# Patient Record
Sex: Male | Born: 1965 | Race: White | Hispanic: No | Marital: Married | Smoking: Never smoker
Health system: Southern US, Community
[De-identification: ages and names within clinical notes are randomized; demographics above are authoritative.]

---

## 2010-08-09 ENCOUNTER — Ambulatory Visit
Admission: RE | Admit: 2010-08-09 | Discharge: 2010-08-09 | Disposition: A | Discharge status: Home / Self Care | Payer: 59 | Source: Ambulatory Visit | Attending: Pulmonary Disease | Admitting: Pulmonary Disease

## 2010-08-09 ENCOUNTER — Other Ambulatory Visit: Payer: Self-pay | Admitting: Pulmonary Disease

## 2010-10-09 ENCOUNTER — Ambulatory Visit (HOSPITAL_BASED_OUTPATIENT_CLINIC_OR_DEPARTMENT_OTHER): Admission: RE | Admit: 2010-10-09 | Payer: 59 | Source: Ambulatory Visit | Admitting: Otolaryngology

## 2010-12-18 ENCOUNTER — Ambulatory Visit (HOSPITAL_BASED_OUTPATIENT_CLINIC_OR_DEPARTMENT_OTHER)
Admission: RE | Admit: 2010-12-18 | Discharge: 2010-12-18 | Disposition: A | Discharge status: Home / Self Care | Payer: 59 | Source: Ambulatory Visit | Attending: Otolaryngology | Admitting: Otolaryngology

## 2010-12-18 ENCOUNTER — Other Ambulatory Visit (INDEPENDENT_AMBULATORY_CARE_PROVIDER_SITE_OTHER): Payer: Self-pay | Admitting: Otolaryngology

## 2010-12-18 DIAGNOSIS — J320 Chronic maxillary sinusitis: Secondary | ICD-10-CM | POA: Insufficient documentation

## 2010-12-18 DIAGNOSIS — Z01812 Encounter for preprocedural laboratory examination: Secondary | ICD-10-CM | POA: Insufficient documentation

## 2010-12-18 DIAGNOSIS — J343 Hypertrophy of nasal turbinates: Secondary | ICD-10-CM | POA: Insufficient documentation

## 2010-12-18 LAB — POCT HEMOGLOBIN-HEMACUE: Hemoglobin: 15 g/dL (ref 13.0–17.0)

## 2010-12-26 NOTE — Op Note (Signed)
Jeff Trujillo, Jeff Trujillo NO.:  [id number]  MEDICAL RECORD NO.:  [id number]  LOCATION:                                 FACILITY:  PHYSICIAN:  Newman Pies, MD                 DATE OF BIRTH:  DATE OF PROCEDURE:  12/18/2010 DATE OF DISCHARGE:                              OPERATIVE REPORT   SURGEON:  Newman Pies, MD.  PREOPERATIVE DIAGNOSES: 1. Bilateral chronic maxillary sinusitis. 2. Bilateral chronic nasal obstruction. 3. Bilateral inferior turbinate hypertrophy.  POSTOPERATIVE DIAGNOSES: 1. Bilateral chronic maxillary sinusitis. 2. Bilateral chronic nasal obstruction. 3. Bilateral inferior turbinate hypertrophy.  PROCEDURE PERFORMED: 1. Bilateral maxillary antrostomy with tissue removal. 2. Bilateral partial inferior turbinate resection.  ANESTHESIA:  General endotracheal tube anesthesia.  COMPLICATIONS:  None.  ESTIMATED BLOOD LOSS:  50 mL.  INDICATIONS FOR PROCEDURE:  The patient is a 45 year old male with a history of chronic nasal obstruction and chronic maxillary sinusitis. He previously underwent septoplasty and bilateral turbinate reduction surgery in 2006.  He was previously treated with multiple courses of antibiotics, nasal saline irrigation, and steroid nasal spray.  However, CT scan showed persistent bilateral maxillary opacification.  His recent CT scan showed worsening of his opacification versus his prior imaging study in 2006.  On examination, he was also noted to have significant bilateral inferior turbinate hypertrophy.  Based on the above findings, the decision was made for the patient to undergo the above-stated procedures.  The risks, benefits, alternatives, and details of the procedure were discussed with the patient and his wife.  Questions were invited and answered.  Informed consent was obtained.  DESCRIPTION OF PROCEDURE:  The patient was taken to the operating room and placed supine on the operating table.  General endotracheal  tube anesthesia was administered by the anesthesiologist.  Preop IV antibiotics and Decadron were given.  The patient was prepped and draped in a standard fashion for nasal surgery.  Pledgets soaked with Afrin were placed in both nasal cavities for vasoconstriction.  The pledgets were subsequently removed.  Endoscopic evaluation of both nasal cavities revealed bilateral inferior turbinate hypertrophy and nasal mucosal congestion.  Attention was first focused on the left side.  The left middle turbinate was medialized.  1% lidocaine with 1:100,000 epinephrine was injected onto the lateral nasal wall.  A standard uncinectomy procedure was performed.  At this time, the maxillary opening was completely covered with polypoid tissue.  The polypoid tissue was carefully removed with a combination of Tru-Cut forceps and microdebrider.  The maxillary cavity was noted to be filled with polypoid tissue and a large fungal ball.  The maxillary content was carefully debrided and removed in a piecemeal fashion.  The specimens were sent to pathology department for permanent histologic identification and fungal culture.  The maxillary cavity was copiously irrigated with saline.  Attention was then focused on the inferior turbinate.  The inferior one half of the inferior turbinate was crossclamped with a straight Kelly clamp.  The inferior one half of the inferior turbinate was then resected with a pair of cross cutting scissors.  Hemostasis was achieved with a suction electrocautery device. The  same procedure was repeated on the right side without exception.  No fungal ball was noted on the right side.  However, the maxillary antrum and the maxillary cavity was noted to be covered with polypoid tissue. NasoPore dressing was placed within the middle meatus bilaterally.  That concluded procedure for the patient.  The care of the patient was turned over to the anesthesiologist.  The patient was awakened from  anesthesia without difficulty.  He was extubated and transferred to the recovery room in good condition.  OPERATIVE FINDINGS: 1. Bilateral middle meatus occluded with polypoid tissue.  The left     maxillary sinus was completely filled with fungal ball.  However,     no active infection was noted today. 2. Bilateral inferior turbinate hypertrophy.  SPECIMEN:  Bilateral sinus contents.  FOLLOWUP CARE:  The patient will be discharged home once he is awake and alert.  He will be placed on Vicodin 1-2 tablets p.o. q.4-6 h. p.r.n. pain, and amoxicillin 500 mg p.o. t.i.d. for 5 days.  The patient will follow up in my office in approximately 1 week.     Newman Pies, MD     ST/MEDQ  D:  12/18/2010  T:  12/18/2010  Job:  [id number]  cc:   Kerry Kass, M.D. [facility]  Electronically Signed by Newman Pies MD on 12/26/2010 12:19:36 PM

## 2012-03-02 IMAGING — CT CT MAXILLOFACIAL W/O CM
2 of 3 series · 15 of 37 positions shown, 18 images · non-contrast
Comparison: None

CLINICAL DATA: Chronic sinusitis and congestion.  Sinus surgery

CT MAXILLOFACIAL WITHOUT CONTRAST
TECHNIQUE: Multidetector CT imaging of the maxillofacial
structures was performed. Multiplanar CT image reconstructions were
also generated.

[Series 601: coronal facial · coronal · 0.36mm/px · 3 of 90 slices shown]
[im 30/90  bone]
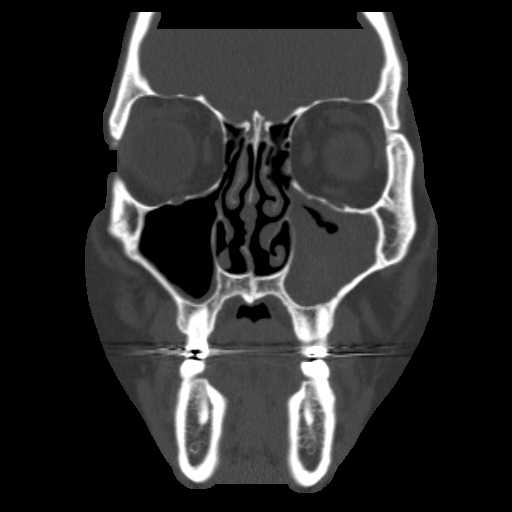
[im 45/90  bone]
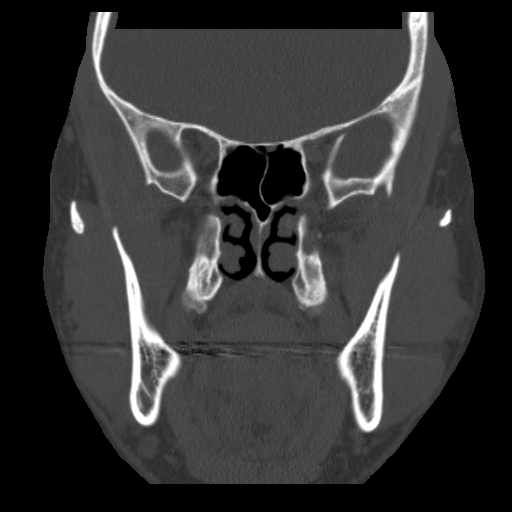
[im 60/90  bone]
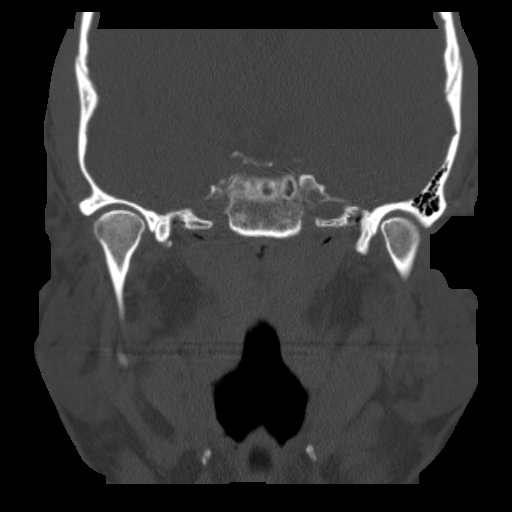

[Series 602: sagittal facial · sagittal · 0.36mm/px · 12 of 92 slices shown, 15 images]
[im 6/92  brain]
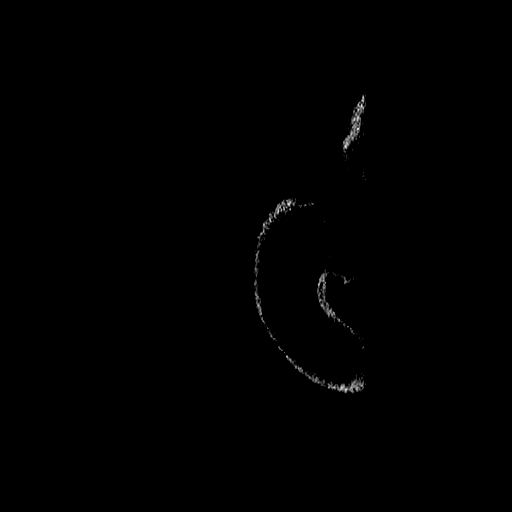
[im 6/92  bone]
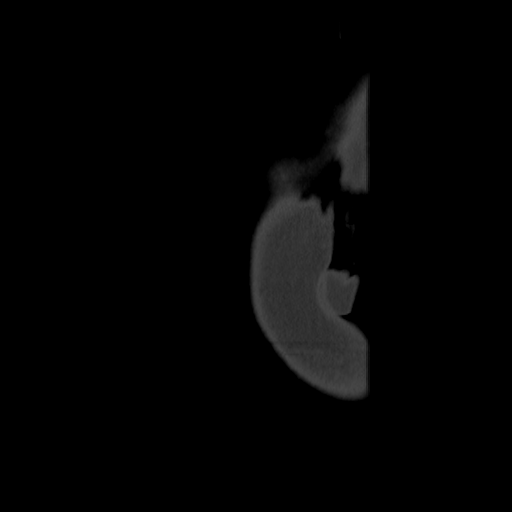
[im 12/92  bone]
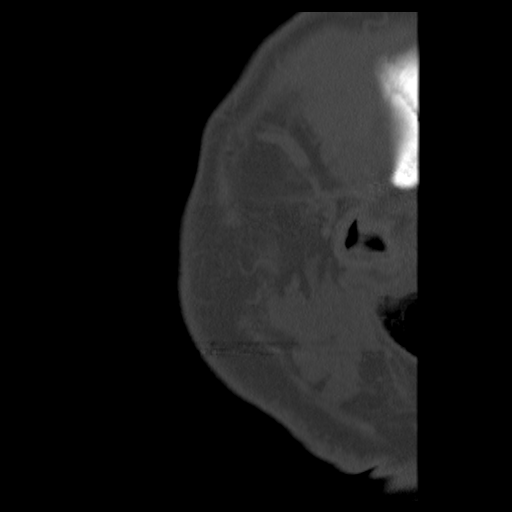
[im 23/92  bone]
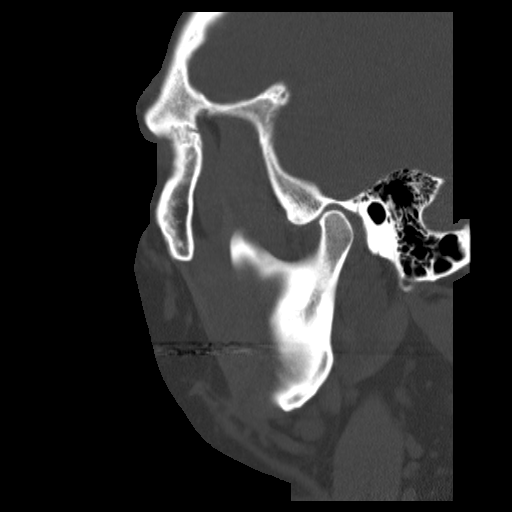
[im 29/92  bone]
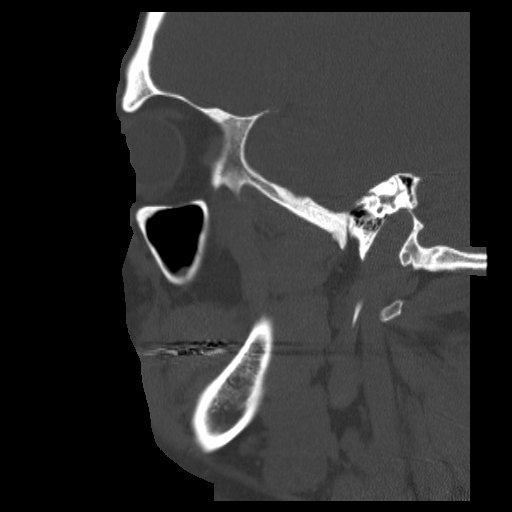
[im 35/92  brain]
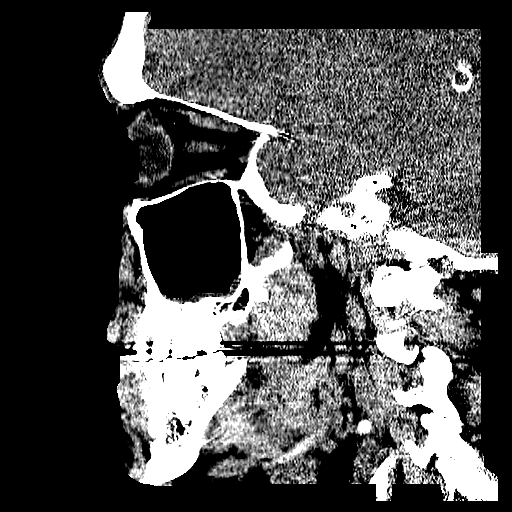
[im 35/92  bone]
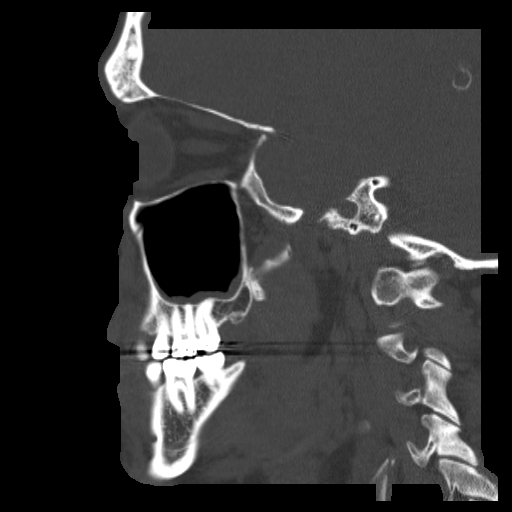
[im 40/92  bone]
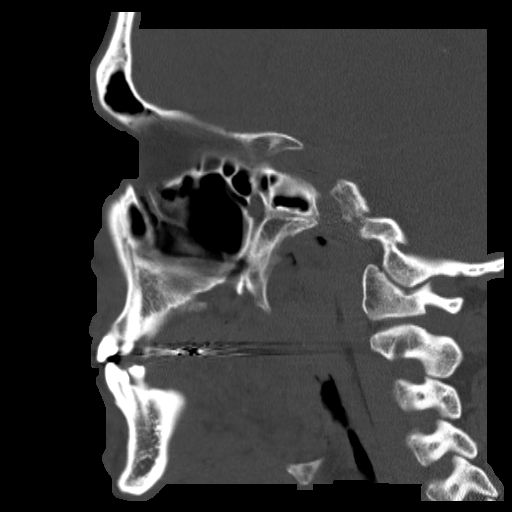
[im 52/92  bone]
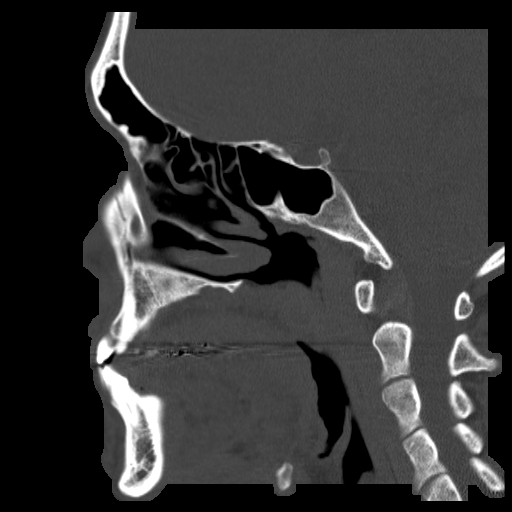
[im 57/92  bone]
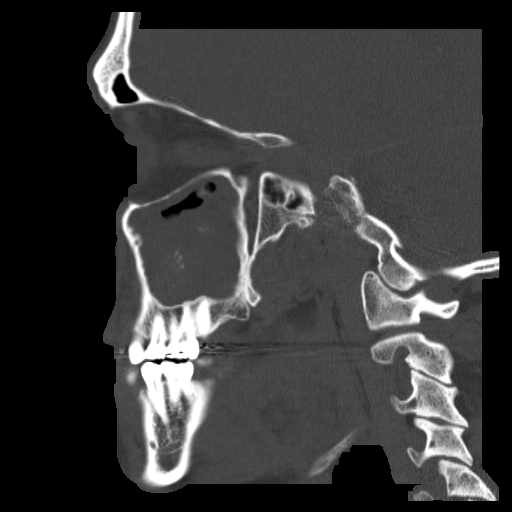
[im 63/92  brain]
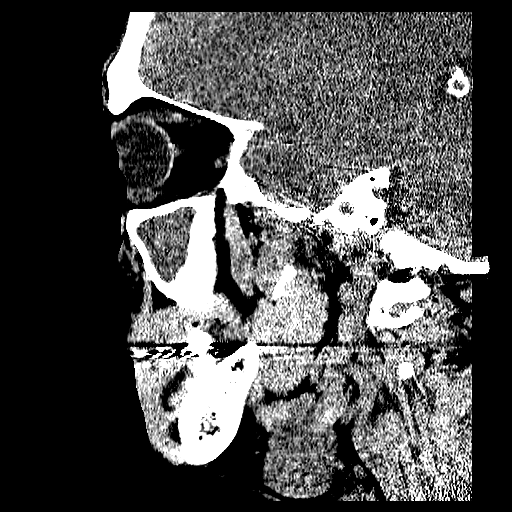
[im 63/92  bone]
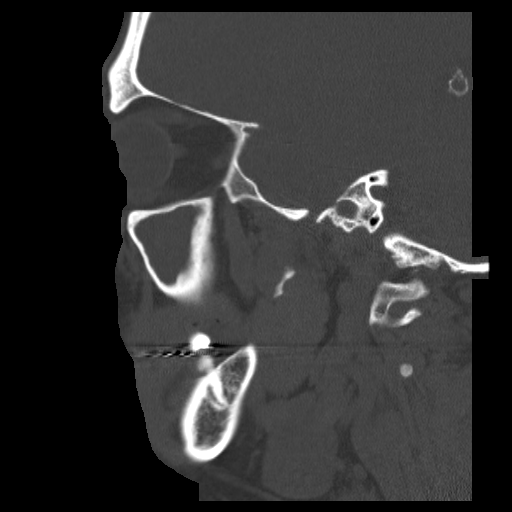
[im 69/92  bone]
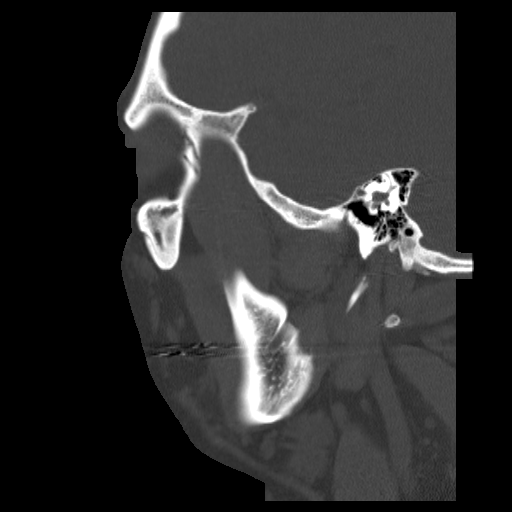
[im 80/92  bone]
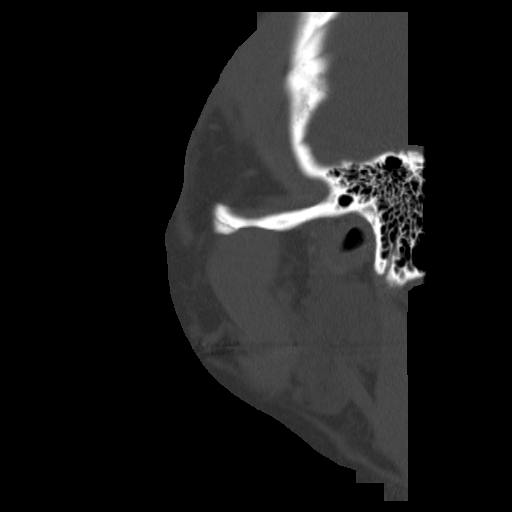
[im 86/92  bone]
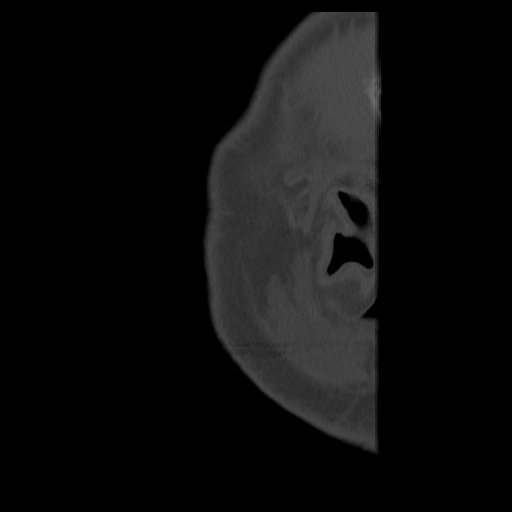

[15 of 37 positions shown; findings below may reference images not displayed]

FINDINGS: Near-complete opacification of the left maxillary sinus.
Calcification is present within the secretions.  Obstruction of the
left maxillary ostium.

Mild mucosal edema in the base of the right maxillary sinus.
Frontal ethmoid and sphenoid sinuses are clear.

No acute bony changes.  Mild chronic thickening of the left
maxillary sinus compatible with chronic sinusitis.  The orbit is
intact.  No soft tissue mass.
IMPRESSION: Chronic sinusitis especially the left maxillary sinus.  No air-
fluid level or bony destruction is identified.

## 2021-01-23 DIAGNOSIS — M8580 Other specified disorders of bone density and structure, unspecified site: Secondary | ICD-10-CM | POA: Diagnosis not present

## 2021-01-23 DIAGNOSIS — R053 Chronic cough: Secondary | ICD-10-CM | POA: Diagnosis not present

## 2021-01-23 DIAGNOSIS — E213 Hyperparathyroidism, unspecified: Secondary | ICD-10-CM | POA: Diagnosis not present

## 2021-01-23 DIAGNOSIS — K219 Gastro-esophageal reflux disease without esophagitis: Secondary | ICD-10-CM | POA: Diagnosis not present

## 2021-01-24 DIAGNOSIS — I871 Compression of vein: Secondary | ICD-10-CM | POA: Diagnosis not present

## 2021-01-29 DIAGNOSIS — L814 Other melanin hyperpigmentation: Secondary | ICD-10-CM | POA: Diagnosis not present

## 2021-01-29 DIAGNOSIS — D225 Melanocytic nevi of trunk: Secondary | ICD-10-CM | POA: Diagnosis not present

## 2021-01-29 DIAGNOSIS — D2272 Melanocytic nevi of left lower limb, including hip: Secondary | ICD-10-CM | POA: Diagnosis not present

## 2021-01-29 DIAGNOSIS — B078 Other viral warts: Secondary | ICD-10-CM | POA: Diagnosis not present

## 2021-02-28 DIAGNOSIS — E785 Hyperlipidemia, unspecified: Secondary | ICD-10-CM | POA: Diagnosis not present

## 2021-02-28 DIAGNOSIS — Z0000 Encounter for general adult medical examination without abnormal findings: Secondary | ICD-10-CM | POA: Diagnosis not present

## 2021-02-28 DIAGNOSIS — Z125 Encounter for screening for malignant neoplasm of prostate: Secondary | ICD-10-CM | POA: Diagnosis not present

## 2021-03-05 DIAGNOSIS — R7301 Impaired fasting glucose: Secondary | ICD-10-CM | POA: Diagnosis not present

## 2021-03-05 DIAGNOSIS — Z23 Encounter for immunization: Secondary | ICD-10-CM | POA: Diagnosis not present

## 2021-03-05 DIAGNOSIS — Z0000 Encounter for general adult medical examination without abnormal findings: Secondary | ICD-10-CM | POA: Diagnosis not present

## 2021-03-05 DIAGNOSIS — E785 Hyperlipidemia, unspecified: Secondary | ICD-10-CM | POA: Diagnosis not present

## 2021-03-20 DIAGNOSIS — B078 Other viral warts: Secondary | ICD-10-CM | POA: Diagnosis not present

## 2021-03-20 DIAGNOSIS — R053 Chronic cough: Secondary | ICD-10-CM | POA: Diagnosis not present

## 2021-03-20 DIAGNOSIS — K219 Gastro-esophageal reflux disease without esophagitis: Secondary | ICD-10-CM | POA: Diagnosis not present

## 2021-03-29 DIAGNOSIS — K5900 Constipation, unspecified: Secondary | ICD-10-CM | POA: Diagnosis not present

## 2021-03-30 DIAGNOSIS — K5901 Slow transit constipation: Secondary | ICD-10-CM | POA: Diagnosis not present

## 2021-03-30 DIAGNOSIS — R1030 Lower abdominal pain, unspecified: Secondary | ICD-10-CM | POA: Diagnosis not present

## 2021-04-02 DIAGNOSIS — H25813 Combined forms of age-related cataract, bilateral: Secondary | ICD-10-CM | POA: Diagnosis not present

## 2021-04-03 DIAGNOSIS — R1030 Lower abdominal pain, unspecified: Secondary | ICD-10-CM | POA: Diagnosis not present

## 2021-04-03 DIAGNOSIS — K5900 Constipation, unspecified: Secondary | ICD-10-CM | POA: Diagnosis not present

## 2021-04-18 DIAGNOSIS — K5900 Constipation, unspecified: Secondary | ICD-10-CM | POA: Diagnosis not present

## 2021-04-18 DIAGNOSIS — R194 Change in bowel habit: Secondary | ICD-10-CM | POA: Diagnosis not present

## 2021-04-19 DIAGNOSIS — B079 Viral wart, unspecified: Secondary | ICD-10-CM | POA: Diagnosis not present

## 2021-05-22 DIAGNOSIS — M859 Disorder of bone density and structure, unspecified: Secondary | ICD-10-CM | POA: Diagnosis not present

## 2021-05-22 DIAGNOSIS — E213 Hyperparathyroidism, unspecified: Secondary | ICD-10-CM | POA: Diagnosis not present

## 2021-05-24 DIAGNOSIS — M79672 Pain in left foot: Secondary | ICD-10-CM | POA: Diagnosis not present

## 2021-05-28 DIAGNOSIS — M545 Low back pain, unspecified: Secondary | ICD-10-CM | POA: Diagnosis not present

## 2021-05-28 DIAGNOSIS — R7301 Impaired fasting glucose: Secondary | ICD-10-CM | POA: Diagnosis not present

## 2021-05-28 DIAGNOSIS — E785 Hyperlipidemia, unspecified: Secondary | ICD-10-CM | POA: Diagnosis not present

## 2021-05-28 DIAGNOSIS — R4584 Anhedonia: Secondary | ICD-10-CM | POA: Diagnosis not present

## 2021-06-17 DIAGNOSIS — R059 Cough, unspecified: Secondary | ICD-10-CM | POA: Diagnosis not present

## 2021-06-17 DIAGNOSIS — J029 Acute pharyngitis, unspecified: Secondary | ICD-10-CM | POA: Diagnosis not present

## 2021-06-17 DIAGNOSIS — H109 Unspecified conjunctivitis: Secondary | ICD-10-CM | POA: Diagnosis not present

## 2021-07-06 DIAGNOSIS — B9689 Other specified bacterial agents as the cause of diseases classified elsewhere: Secondary | ICD-10-CM | POA: Diagnosis not present

## 2021-07-06 DIAGNOSIS — J42 Unspecified chronic bronchitis: Secondary | ICD-10-CM | POA: Diagnosis not present

## 2021-07-11 DIAGNOSIS — H527 Unspecified disorder of refraction: Secondary | ICD-10-CM | POA: Diagnosis not present

## 2021-07-11 DIAGNOSIS — H25813 Combined forms of age-related cataract, bilateral: Secondary | ICD-10-CM | POA: Diagnosis not present

## 2021-07-16 ENCOUNTER — Encounter: Payer: Self-pay | Admitting: Behavioral Health

## 2021-07-16 ENCOUNTER — Other Ambulatory Visit: Payer: Self-pay

## 2021-07-16 ENCOUNTER — Ambulatory Visit (INDEPENDENT_AMBULATORY_CARE_PROVIDER_SITE_OTHER): Payer: BC Managed Care – PPO | Admitting: Behavioral Health

## 2021-07-16 VITALS — BP 123/82 | HR 82 | Ht 72.0 in | Wt 243.0 lb

## 2021-07-16 DIAGNOSIS — R454 Irritability and anger: Secondary | ICD-10-CM | POA: Diagnosis not present

## 2021-07-16 DIAGNOSIS — R4589 Other symptoms and signs involving emotional state: Secondary | ICD-10-CM | POA: Diagnosis not present

## 2021-07-16 MED ORDER — DULOXETINE HCL 30 MG PO CPEP: 30.0000 mg | ORAL_CAPSULE | Freq: Every day | ORAL | 3 refills | Status: DC

## 2021-07-16 NOTE — Progress Notes (Signed)
[facility] MD/PA/NP Initial Note ? ?07/16/2021 3:06 PM ?Jeff Trujillo  ?MRN:  [id number] ? ?Chief Complaint:  ?Chief Complaint   ?Irritability; Establish Care; Medication Refill; Mood Changes ?  ? ? ?HPI:  ?56 year old presents to this office for initial visit and to establish care. He says that his wife began to notice changes in his moods with increasing irritability and "grumpiness" all the time. He was not sure what the cause was. He feels that this started to effect his relationship with his wife. Says that there also has been stressors related to his wife's ongoing health issue.Says he and his wife are very close but they have not been intimate in almost a year. He has been checked for low T and currently has medication for ED on hand for when their emotional well being improves and sex is desired. He has been under the care of Dr. Edgar Frisk as was prescribed Cymbalta. He did not believe it was working but the medication "Kicked in" after a month or so. Says he is feeling much better and currently has zero anxiety and depression due to the medication working. Negative PHQ-2. Says he would like psychiatry to continue to manage his medication and provide refills. He does not want to make any changes at this time. Denies mania, no psychosis, No SI/HI.  ? ?No prior psychiatric HX.   ? ? ?Visit Diagnosis:  ?  ICD-10-CM   ?1. Irritability  R45.4 DULoxetine (CYMBALTA) 30 MG capsule  ?  ?2. Moodiness  R45.89 DULoxetine (CYMBALTA) 30 MG capsule  ?  ? ? ?Past Psychiatric History: none ? ?Past Medical History: see chart ? ?Family Psychiatric History: none ? ?Family History: No family history on file. ? ?Social History:  ?Social History  ? ?Socioeconomic History  ? Marital status: Married  ?  Spouse name: Kerby Less  ? Number of children: 0  ? Years of education: 55  ? Highest education level: Bachelor's degree (e.g., BA, AB, BS)  ?Occupational History  ? Occupation: Higher education careers adviser  ?  Comment: [employer]  ?Tobacco Use  ?  Smoking status: Not on file  ? Smokeless tobacco: Not on file  ?Substance and Sexual Activity  ? Alcohol use: Not on file  ? Drug use: Not on file  ? Sexual activity: Not Currently  ?Other Topics Concern  ? Not on file  ?Social History Narrative  ? Lives in [place] with wife. Enjoys Racing cars, scuba diving in free time.  ? ?Social Determinants of Health  ? ?Financial Resource Strain: Not on file  ?Food Insecurity: Not on file  ?Transportation Needs: Not on file  ?Physical Activity: Not on file  ?Stress: Not on file  ?Social Connections: Not on file  ? ? ?Allergies: No Known Allergies ? ?Metabolic Disorder Labs: ?No results found for: HGBA1C, MPG ?No results found for: PROLACTIN ?No results found for: CHOL, TRIG, HDL, CHOLHDL, VLDL, LDLCALC ?No results found for: TSH ? ?Therapeutic Level Labs: ?No results found for: LITHIUM ?No results found for: VALPROATE ?No components found for:  CBMZ ? ?Current Medications: ?Current Outpatient Medications  ?Medication Sig Dispense Refill  ? sildenafil (VIAGRA) 100 MG tablet Take by mouth.    ? ALLEGRA ALLERGY 60 MG tablet     ? cyclobenzaprine (FLEXERIL) 5 MG tablet     ? DULoxetine (CYMBALTA) 30 MG capsule Take 1 capsule (30 mg total) by mouth daily. 30 capsule 3  ? gabapentin (NEURONTIN) 300 MG capsule     ? meloxicam (MOBIC) 15 MG  tablet     ? PRILOSEC 2.5 MG PACK     ? rosuvastatin (CRESTOR) 20 MG tablet Take 20 mg by mouth at bedtime.    ? trimethoprim-polymyxin b (POLYTRIM) ophthalmic solution SMARTSIG:In Eye(s)    ? ?No current facility-administered medications for this visit.  ? ? ?Medication Side Effects: none ? ?Orders placed this visit:  No orders of the defined types were placed in this encounter. ? ? ?Psychiatric Specialty Exam: ? ?Review of Systems  ?HENT:  Positive for nosebleeds and sinus pain.   ?Eyes:  Positive for visual disturbance.  ?Allergic/Immunologic: Positive for environmental allergies.  ?Neurological: Negative.    ?There were no vitals taken for  this visit.There is no height or weight on file to calculate BMI.  ?General Appearance: Casual, Neat, and Well Groomed  ?Eye Contact:  Good  ?Speech:  Clear and Coherent  ?Volume:  Normal  ?Mood:  NA  ?Affect:  Appropriate  ?Thought Process:  Coherent  ?Orientation:  Full (Time, Place, and Person)  ?Thought Content: Logical   ?Suicidal Thoughts:  No  ?Homicidal Thoughts:  No  ?Memory:  WNL  ?Judgement:  Good  ?Insight:  Good  ?Psychomotor Activity:  Normal  ?Concentration:  Concentration: Good  ?Recall:  Good  ?Fund of Knowledge: Good  ?Language: Good  ?Assets:  Desire for Improvement  ?ADL's:  Intact  ?Cognition: WNL  ?Prognosis:  Good  ? ?Screenings:  ?PHQ2-9   ? ?Flowsheet Row Office Visit from 07/16/2021 in [facility]  ?PHQ-2 Total Score 0  ? ?  ? ? ?Receiving Psychotherapy: No  ? ?Treatment Plan/Recommendations:  ? ?Greater than 50% of face to face time with patient was spent on counseling and coordination of care. We discussed his new hx of irritability and moodiness. He has ruled out low T. We discussed Cymbalta working very well for him and no changes to his medications required. To continue monitoring and will follow up in 2 months. ?To continue Cymbalta 30 mg daily ?To report side effects or worsening symptoms ?Will follow up in 2 months to reassess ?Provided emergency contact information ?Reviewed PDMP   ? ? ? ?Joan Flores, NP ? ?         ?

## 2021-08-15 DIAGNOSIS — R7301 Impaired fasting glucose: Secondary | ICD-10-CM | POA: Diagnosis not present

## 2021-08-15 DIAGNOSIS — E785 Hyperlipidemia, unspecified: Secondary | ICD-10-CM | POA: Diagnosis not present

## 2021-08-15 DIAGNOSIS — Z79899 Other long term (current) drug therapy: Secondary | ICD-10-CM | POA: Diagnosis not present

## 2021-09-14 DIAGNOSIS — R4584 Anhedonia: Secondary | ICD-10-CM | POA: Diagnosis not present

## 2021-09-14 DIAGNOSIS — R7301 Impaired fasting glucose: Secondary | ICD-10-CM | POA: Diagnosis not present

## 2021-09-14 DIAGNOSIS — Z23 Encounter for immunization: Secondary | ICD-10-CM | POA: Diagnosis not present

## 2021-09-14 DIAGNOSIS — E785 Hyperlipidemia, unspecified: Secondary | ICD-10-CM | POA: Diagnosis not present

## 2021-09-18 DIAGNOSIS — K219 Gastro-esophageal reflux disease without esophagitis: Secondary | ICD-10-CM | POA: Diagnosis not present

## 2021-09-18 DIAGNOSIS — H9042 Sensorineural hearing loss, unilateral, left ear, with unrestricted hearing on the contralateral side: Secondary | ICD-10-CM | POA: Diagnosis not present

## 2021-09-18 DIAGNOSIS — R053 Chronic cough: Secondary | ICD-10-CM | POA: Diagnosis not present

## 2021-09-25 ENCOUNTER — Other Ambulatory Visit: Payer: Self-pay | Admitting: Otolaryngology

## 2021-09-25 DIAGNOSIS — H9042 Sensorineural hearing loss, unilateral, left ear, with unrestricted hearing on the contralateral side: Secondary | ICD-10-CM

## 2021-10-08 ENCOUNTER — Ambulatory Visit: Payer: BC Managed Care – PPO | Admitting: Behavioral Health

## 2021-10-10 ENCOUNTER — Other Ambulatory Visit: Payer: Self-pay

## 2021-10-10 DIAGNOSIS — R413 Other amnesia: Secondary | ICD-10-CM | POA: Diagnosis not present

## 2021-10-10 DIAGNOSIS — F419 Anxiety disorder, unspecified: Secondary | ICD-10-CM | POA: Diagnosis not present

## 2021-10-16 ENCOUNTER — Ambulatory Visit
Admission: RE | Admit: 2021-10-16 | Discharge: 2021-10-16 | Disposition: A | Discharge status: Home / Self Care | Payer: BC Managed Care – PPO | Source: Ambulatory Visit | Attending: Otolaryngology | Admitting: Otolaryngology

## 2021-10-16 DIAGNOSIS — J320 Chronic maxillary sinusitis: Secondary | ICD-10-CM | POA: Diagnosis not present

## 2021-10-16 DIAGNOSIS — H9042 Sensorineural hearing loss, unilateral, left ear, with unrestricted hearing on the contralateral side: Secondary | ICD-10-CM

## 2021-10-16 DIAGNOSIS — H9192 Unspecified hearing loss, left ear: Secondary | ICD-10-CM | POA: Diagnosis not present

## 2021-10-16 MED ORDER — GADOBENATE DIMEGLUMINE 529 MG/ML IV SOLN
20.0000 mL | Freq: Once | INTRAVENOUS | Status: AC | PRN
  Administered 2021-10-16: 20 mL via INTRAVENOUS

## 2021-10-24 DIAGNOSIS — H9042 Sensorineural hearing loss, unilateral, left ear, with unrestricted hearing on the contralateral side: Secondary | ICD-10-CM | POA: Diagnosis not present

## 2021-11-07 ENCOUNTER — Ambulatory Visit: Payer: BC Managed Care – PPO | Admitting: Behavioral Health

## 2021-11-13 DIAGNOSIS — B078 Other viral warts: Secondary | ICD-10-CM | POA: Diagnosis not present

## 2021-11-14 ENCOUNTER — Ambulatory Visit (INDEPENDENT_AMBULATORY_CARE_PROVIDER_SITE_OTHER): Payer: BC Managed Care – PPO | Admitting: Behavioral Health

## 2021-11-14 ENCOUNTER — Encounter: Payer: Self-pay | Admitting: Behavioral Health

## 2021-11-14 DIAGNOSIS — R454 Irritability and anger: Secondary | ICD-10-CM | POA: Diagnosis not present

## 2021-11-14 DIAGNOSIS — R4589 Other symptoms and signs involving emotional state: Secondary | ICD-10-CM | POA: Diagnosis not present

## 2021-11-14 MED ORDER — DULOXETINE HCL 60 MG PO CPEP: 60.0000 mg | ORAL_CAPSULE | Freq: Every day | ORAL | 2 refills | Status: DC

## 2021-11-14 NOTE — Progress Notes (Signed)
[facility] Med Check  Patient ID: Jeff Trujillo,  MRN: [id number]  PCP: Verl Bangs, MD  Date of Evaluation: 11/14/2021 Time spent:30 minutes  Chief Complaint:  Chief Complaint   Irritability; Medication Refill; Medication Problem; Family Problem     HISTORY/CURRENT STATUS: HPI  56 year old presents to this office for follow up and medication management. Says that he noticed improvement for a few weeks and then became grumpy and mood He says that his wife began to notice changes in his moods with increasing irritability and "grumpiness" all the time. He was not sure what the cause was. He did acknowledge that his wife loses her temper and shouts at him sometimes. Says its very difficult for him to get over easily. Says he is feeling much better and currently has zero anxiety and depression due to the medication working. Says he just wants to stop being so subject to mood fluctuations.  He is requesting increase of his Cymbalta to see if it may help with irritability. Denies mania, no psychosis, No SI/HI.    No prior psychiatric HX.      Individual Medical History/ Review of Systems: Changes? :No   Allergies: Patient has no known allergies.  Current Medications:  Current Outpatient Medications:    DULoxetine (CYMBALTA) 60 MG capsule, Take 1 capsule (60 mg total) by mouth daily., Disp: 30 capsule, Rfl: 2   ALLEGRA ALLERGY 60 MG tablet, , Disp: , Rfl:    cyclobenzaprine (FLEXERIL) 5 MG tablet, , Disp: , Rfl:    DULoxetine (CYMBALTA) 30 MG capsule, Take 1 capsule (30 mg total) by mouth daily., Disp: 30 capsule, Rfl: 3   gabapentin (NEURONTIN) 300 MG capsule, , Disp: , Rfl:    meloxicam (MOBIC) 15 MG tablet, , Disp: , Rfl:    PRILOSEC 2.5 MG PACK, , Disp: , Rfl:    rosuvastatin (CRESTOR) 20 MG tablet, Take 20 mg by mouth at bedtime., Disp: , Rfl:    sildenafil (VIAGRA) 100 MG tablet, Take by mouth., Disp: , Rfl:    trimethoprim-polymyxin b (POLYTRIM) ophthalmic  solution, SMARTSIG:In Eye(s), Disp: , Rfl:  Medication Side Effects: none  Family Medical/ Social History: Changes? No  MENTAL HEALTH EXAM:  There were no vitals taken for this visit.There is no height or weight on file to calculate BMI.  General Appearance: Casual and Neat  Eye Contact:  Good  Speech:  Clear and Coherent  Volume:  Normal  Mood:  NA  Affect:  Appropriate  Thought Process:  Coherent  Orientation:  Full (Time, Place, and Person)  Thought Content: Logical   Suicidal Thoughts:  No  Homicidal Thoughts:  No  Memory:  WNL  Judgement:  Good  Insight:  Good  Psychomotor Activity:  Normal  Concentration:  Concentration: Good  Recall:  Good  Fund of Knowledge: Good  Language: Good  Assets:  Desire for Improvement  ADL's:  Intact  Cognition: WNL  Prognosis:  Good    DIAGNOSES:    ICD-10-CM   1. Irritability  R45.4 DULoxetine (CYMBALTA) 60 MG capsule    2. Moodiness  R45.89 DULoxetine (CYMBALTA) 60 MG capsule      Receiving Psychotherapy: No    RECOMMENDATIONS:   Greater than 50% of  30 min face to face time with patient was spent on counseling and coordination of care. We discussed his new hx of irritability and moodiness. He says his  wife said his medication worked for a couple of weeks and then she could tell no difference.  He is requesting to increase his Cymbalta at this time. We discussed the possibility for a mood stabilizer but we would reassess next visit.  To continue monitoring and will follow up in 2 months. Will increase Cymbalta to 60 mg daily To report side effects or worsening symptoms Will follow up in 2 months to reassess Provided emergency contact information Reviewed PDMP     Joan Flores, NP

## 2021-11-15 DIAGNOSIS — K219 Gastro-esophageal reflux disease without esophagitis: Secondary | ICD-10-CM | POA: Diagnosis not present

## 2021-11-15 DIAGNOSIS — E785 Hyperlipidemia, unspecified: Secondary | ICD-10-CM | POA: Diagnosis not present

## 2021-11-15 DIAGNOSIS — F419 Anxiety disorder, unspecified: Secondary | ICD-10-CM | POA: Diagnosis not present

## 2021-11-15 DIAGNOSIS — H25811 Combined forms of age-related cataract, right eye: Secondary | ICD-10-CM | POA: Diagnosis not present

## 2021-11-15 DIAGNOSIS — R413 Other amnesia: Secondary | ICD-10-CM | POA: Diagnosis not present

## 2021-11-15 DIAGNOSIS — H52221 Regular astigmatism, right eye: Secondary | ICD-10-CM | POA: Diagnosis not present

## 2021-11-15 DIAGNOSIS — F32A Depression, unspecified: Secondary | ICD-10-CM | POA: Diagnosis not present

## 2021-11-15 DIAGNOSIS — E669 Obesity, unspecified: Secondary | ICD-10-CM | POA: Diagnosis not present

## 2021-11-15 DIAGNOSIS — Z79899 Other long term (current) drug therapy: Secondary | ICD-10-CM | POA: Diagnosis not present

## 2021-11-15 DIAGNOSIS — Z885 Allergy status to narcotic agent status: Secondary | ICD-10-CM | POA: Diagnosis not present

## 2021-11-20 DIAGNOSIS — E213 Hyperparathyroidism, unspecified: Secondary | ICD-10-CM | POA: Diagnosis not present

## 2021-11-20 DIAGNOSIS — M859 Disorder of bone density and structure, unspecified: Secondary | ICD-10-CM | POA: Diagnosis not present

## 2021-12-11 DIAGNOSIS — Z885 Allergy status to narcotic agent status: Secondary | ICD-10-CM | POA: Diagnosis not present

## 2021-12-11 DIAGNOSIS — E669 Obesity, unspecified: Secondary | ICD-10-CM | POA: Diagnosis not present

## 2021-12-11 DIAGNOSIS — Z79899 Other long term (current) drug therapy: Secondary | ICD-10-CM | POA: Diagnosis not present

## 2021-12-11 DIAGNOSIS — H25812 Combined forms of age-related cataract, left eye: Secondary | ICD-10-CM | POA: Diagnosis not present

## 2021-12-11 DIAGNOSIS — Z961 Presence of intraocular lens: Secondary | ICD-10-CM | POA: Diagnosis not present

## 2021-12-11 DIAGNOSIS — Z9841 Cataract extraction status, right eye: Secondary | ICD-10-CM | POA: Diagnosis not present

## 2021-12-11 DIAGNOSIS — Z6832 Body mass index (BMI) 32.0-32.9, adult: Secondary | ICD-10-CM | POA: Diagnosis not present

## 2021-12-11 DIAGNOSIS — E785 Hyperlipidemia, unspecified: Secondary | ICD-10-CM | POA: Diagnosis not present

## 2021-12-18 DIAGNOSIS — F419 Anxiety disorder, unspecified: Secondary | ICD-10-CM | POA: Diagnosis not present

## 2021-12-18 DIAGNOSIS — Z8659 Personal history of other mental and behavioral disorders: Secondary | ICD-10-CM | POA: Diagnosis not present

## 2021-12-18 DIAGNOSIS — R413 Other amnesia: Secondary | ICD-10-CM | POA: Diagnosis not present

## 2021-12-19 DIAGNOSIS — D485 Neoplasm of uncertain behavior of skin: Secondary | ICD-10-CM | POA: Diagnosis not present

## 2021-12-19 DIAGNOSIS — L080 Pyoderma: Secondary | ICD-10-CM | POA: Diagnosis not present

## 2021-12-19 DIAGNOSIS — L814 Other melanin hyperpigmentation: Secondary | ICD-10-CM | POA: Diagnosis not present

## 2021-12-19 DIAGNOSIS — D225 Melanocytic nevi of trunk: Secondary | ICD-10-CM | POA: Diagnosis not present

## 2022-01-04 ENCOUNTER — Other Ambulatory Visit: Payer: Self-pay

## 2022-01-04 ENCOUNTER — Telehealth: Payer: Self-pay | Admitting: Behavioral Health

## 2022-01-04 DIAGNOSIS — R4589 Other symptoms and signs involving emotional state: Secondary | ICD-10-CM

## 2022-01-04 DIAGNOSIS — R454 Irritability and anger: Secondary | ICD-10-CM

## 2022-01-04 MED ORDER — DULOXETINE HCL 60 MG PO CPEP: 60.0000 mg | ORAL_CAPSULE | Freq: Every day | ORAL | 0 refills | Status: DC

## 2022-01-04 NOTE — Telephone Encounter (Signed)
Rx sent please advise about appt

## 2022-01-04 NOTE — Telephone Encounter (Signed)
Pt LVM at 3:03pm.  He would like the 3 month script sent to [facility] for Cymbalta.  He also would like Arlys John to call him and let him know if he really needs to be seen in Oct since he's doing well.  Next appt 10/9

## 2022-01-07 ENCOUNTER — Ambulatory Visit: Payer: BC Managed Care – PPO | Admitting: Behavioral Health

## 2022-01-07 NOTE — Telephone Encounter (Signed)
Pt informed

## 2022-01-08 DIAGNOSIS — Z8659 Personal history of other mental and behavioral disorders: Secondary | ICD-10-CM | POA: Diagnosis not present

## 2022-01-08 DIAGNOSIS — R413 Other amnesia: Secondary | ICD-10-CM | POA: Diagnosis not present

## 2022-01-08 DIAGNOSIS — F419 Anxiety disorder, unspecified: Secondary | ICD-10-CM | POA: Diagnosis not present

## 2022-01-08 NOTE — Telephone Encounter (Signed)
December should be fine since he got a 90 day rx

## 2022-01-08 NOTE — Telephone Encounter (Signed)
How far out do you want me to reschedule with him?

## 2022-01-09 NOTE — Telephone Encounter (Signed)
Yes, December would be good.

## 2022-01-16 NOTE — Telephone Encounter (Signed)
He said he is a 'snowbird', so he will be out of town until Jan.  But we have rescheduled to Jan 24

## 2022-02-04 ENCOUNTER — Ambulatory Visit: Payer: BC Managed Care – PPO | Admitting: Behavioral Health

## 2022-02-06 DIAGNOSIS — I871 Compression of vein: Secondary | ICD-10-CM | POA: Diagnosis not present

## 2022-02-07 DIAGNOSIS — H43391 Other vitreous opacities, right eye: Secondary | ICD-10-CM | POA: Diagnosis not present

## 2022-02-07 DIAGNOSIS — H33311 Horseshoe tear of retina without detachment, right eye: Secondary | ICD-10-CM | POA: Diagnosis not present

## 2022-02-07 DIAGNOSIS — H43811 Vitreous degeneration, right eye: Secondary | ICD-10-CM | POA: Diagnosis not present

## 2022-02-07 DIAGNOSIS — H35461 Secondary vitreoretinal degeneration, right eye: Secondary | ICD-10-CM | POA: Diagnosis not present

## 2022-02-11 DIAGNOSIS — H31091 Other chorioretinal scars, right eye: Secondary | ICD-10-CM | POA: Diagnosis not present

## 2022-02-11 DIAGNOSIS — H43811 Vitreous degeneration, right eye: Secondary | ICD-10-CM | POA: Diagnosis not present

## 2022-02-21 DIAGNOSIS — H43391 Other vitreous opacities, right eye: Secondary | ICD-10-CM | POA: Diagnosis not present

## 2022-02-21 DIAGNOSIS — H43811 Vitreous degeneration, right eye: Secondary | ICD-10-CM | POA: Diagnosis not present

## 2022-03-25 DIAGNOSIS — H31091 Other chorioretinal scars, right eye: Secondary | ICD-10-CM | POA: Diagnosis not present

## 2022-03-25 DIAGNOSIS — H43391 Other vitreous opacities, right eye: Secondary | ICD-10-CM | POA: Diagnosis not present

## 2022-03-25 DIAGNOSIS — B078 Other viral warts: Secondary | ICD-10-CM | POA: Diagnosis not present

## 2022-03-27 ENCOUNTER — Other Ambulatory Visit: Payer: Self-pay | Admitting: Behavioral Health

## 2022-03-27 DIAGNOSIS — R454 Irritability and anger: Secondary | ICD-10-CM

## 2022-03-27 DIAGNOSIS — R4589 Other symptoms and signs involving emotional state: Secondary | ICD-10-CM

## 2022-03-28 DIAGNOSIS — H9312 Tinnitus, left ear: Secondary | ICD-10-CM | POA: Diagnosis not present

## 2022-03-28 DIAGNOSIS — H9042 Sensorineural hearing loss, unilateral, left ear, with unrestricted hearing on the contralateral side: Secondary | ICD-10-CM | POA: Diagnosis not present

## 2022-04-16 DIAGNOSIS — H9042 Sensorineural hearing loss, unilateral, left ear, with unrestricted hearing on the contralateral side: Secondary | ICD-10-CM | POA: Diagnosis not present

## 2022-05-20 DIAGNOSIS — M25522 Pain in left elbow: Secondary | ICD-10-CM | POA: Diagnosis not present

## 2022-05-20 DIAGNOSIS — Z0000 Encounter for general adult medical examination without abnormal findings: Secondary | ICD-10-CM | POA: Diagnosis not present

## 2022-05-20 DIAGNOSIS — E785 Hyperlipidemia, unspecified: Secondary | ICD-10-CM | POA: Diagnosis not present

## 2022-05-20 DIAGNOSIS — Z6835 Body mass index (BMI) 35.0-35.9, adult: Secondary | ICD-10-CM | POA: Diagnosis not present

## 2022-05-20 DIAGNOSIS — G8929 Other chronic pain: Secondary | ICD-10-CM | POA: Diagnosis not present

## 2022-05-20 DIAGNOSIS — R7301 Impaired fasting glucose: Secondary | ICD-10-CM | POA: Diagnosis not present

## 2022-05-21 DIAGNOSIS — M25522 Pain in left elbow: Secondary | ICD-10-CM | POA: Diagnosis not present

## 2022-05-21 DIAGNOSIS — M7522 Bicipital tendinitis, left shoulder: Secondary | ICD-10-CM | POA: Diagnosis not present

## 2022-05-22 ENCOUNTER — Ambulatory Visit (INDEPENDENT_AMBULATORY_CARE_PROVIDER_SITE_OTHER): Payer: BC Managed Care – PPO | Admitting: Behavioral Health

## 2022-05-22 ENCOUNTER — Encounter: Payer: Self-pay | Admitting: Behavioral Health

## 2022-05-22 DIAGNOSIS — R4589 Other symptoms and signs involving emotional state: Secondary | ICD-10-CM | POA: Diagnosis not present

## 2022-05-22 DIAGNOSIS — N522 Drug-induced erectile dysfunction: Secondary | ICD-10-CM

## 2022-05-22 DIAGNOSIS — R454 Irritability and anger: Secondary | ICD-10-CM | POA: Diagnosis not present

## 2022-05-22 MED ORDER — DULOXETINE HCL 30 MG PO CPEP: 30.0000 mg | ORAL_CAPSULE | Freq: Every day | ORAL | 1 refills | Status: DC

## 2022-05-22 MED ORDER — SILDENAFIL CITRATE 100 MG PO TABS: 100.0000 mg | ORAL_TABLET | ORAL | 1 refills | Status: AC | PRN

## 2022-05-22 NOTE — Progress Notes (Signed)
[facility] Med Check  Patient ID: Jeff Trujillo,  MRN: [id number]  PCP: Jamesetta Geralds, MD  Date of Evaluation: 05/22/2022 Time spent:30 minutes  Chief Complaint:  Chief Complaint   Medication Problem; Medication Refill; Patient Education; Follow-up; Irritability     HISTORY/CURRENT STATUS: HPI  57 year old presents to this office for follow up and medication management. He is very talkative with rapid speech at times. Says he has been doing "ok" with everything but has increased stress from his relationship with wife. Complains that he has to do everything around the home and wife does nothing. Says that she is seeking care individually and they have considered marriage counseling. He does not believe more medication or changes will help.  He did acknowledge that his wife loses her temper and shouts at him sometimes. Says its very difficult for him to get over easily.  Denies mania, no psychosis, No SI/HI.    No prior psychiatric HX.      Individual Medical History/ Review of Systems: Changes? :No   Allergies: Patient has no known allergies.  Current Medications:  Current Outpatient Medications:    ALLEGRA ALLERGY 60 MG tablet, , Disp: , Rfl:    cyclobenzaprine (FLEXERIL) 5 MG tablet, , Disp: , Rfl:    DULoxetine (CYMBALTA) 30 MG capsule, Take 1 capsule (30 mg total) by mouth daily., Disp: 90 capsule, Rfl: 1   DULoxetine (CYMBALTA) 60 MG capsule, TAKE 1 CAPSULE DAILY, Disp: 90 capsule, Rfl: 0   gabapentin (NEURONTIN) 300 MG capsule, , Disp: , Rfl:    meloxicam (MOBIC) 15 MG tablet, , Disp: , Rfl:    PRILOSEC 2.5 MG PACK, , Disp: , Rfl:    rosuvastatin (CRESTOR) 20 MG tablet, Take 20 mg by mouth at bedtime., Disp: , Rfl:    sildenafil (VIAGRA) 100 MG tablet, Take 1 tablet (100 mg total) by mouth as needed for erectile dysfunction., Disp: 30 tablet, Rfl: 1   trimethoprim-polymyxin b (POLYTRIM) ophthalmic solution, SMARTSIG:In Eye(s), Disp: , Rfl:  Medication Side  Effects: none  Family Medical/ Social History: Changes? No  MENTAL HEALTH EXAM:  There were no vitals taken for this visit.There is no height or weight on file to calculate BMI.  General Appearance: Casual, Neat, and Well Groomed  Eye Contact:  Good  Speech:  Clear and Coherent  Volume:  Normal  Mood:  Anxious and Depressed  Affect:  Appropriate  Thought Process:  Coherent  Orientation:  Full (Time, Place, and Person)  Thought Content: Logical   Suicidal Thoughts:  No  Homicidal Thoughts:  No  Memory:  WNL  Judgement:  Good  Insight:  Good  Psychomotor Activity:  Normal  Concentration:  Concentration: Good  Recall:  Good  Fund of Knowledge: Good  Language: Good  Assets:  Desire for Improvement  ADL's:  Intact  Cognition: WNL  Prognosis:  Good    DIAGNOSES:    ICD-10-CM   1. Irritability  R45.4 DULoxetine (CYMBALTA) 30 MG capsule    2. Moodiness  R45.89 DULoxetine (CYMBALTA) 30 MG capsule    3. 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 We discussed his continued  irritability and moodiness. Discussed family dynamics and relationship with spouse that is strained. Says wife is continuing with therapy and has sought marital counseling.  Understands much of his problem is situational and related to relationship.  He does not want to increase his medication at this time. We discussed the possibility for a mood stabilizer but we would reassess next visit.  To continue monitoring and will follow up in 6 months. Continue Cymbalta to 60 mg daily To report side effects or worsening symptoms Will follow up in 6 months to reassess Provided emergency contact information Reviewed [place], NP

## 2022-05-23 ENCOUNTER — Telehealth: Payer: Self-pay | Admitting: Behavioral Health

## 2022-05-23 DIAGNOSIS — R454 Irritability and anger: Secondary | ICD-10-CM

## 2022-05-23 DIAGNOSIS — R4589 Other symptoms and signs involving emotional state: Secondary | ICD-10-CM

## 2022-05-23 MED ORDER — DULOXETINE HCL 60 MG PO CPEP: 60.0000 mg | ORAL_CAPSULE | Freq: Every day | ORAL | 0 refills | Status: DC

## 2022-05-23 NOTE — Telephone Encounter (Signed)
New Rx sent. Per note from yesterday's visit patient is on 60 mg. Discontinued 30 mg dose in Epic.

## 2022-05-23 NOTE — Telephone Encounter (Signed)
Pt LVM @ 9:09a.  He said the script Aaron Edelman sent yesterday was incorrect. It was supposed to be for Duloxetine 60mg , but it was sent for 30mg . Pls correct and resend to [facility].  No upcoming appt scheduled.

## 2022-06-11 DIAGNOSIS — M7522 Bicipital tendinitis, left shoulder: Secondary | ICD-10-CM | POA: Diagnosis not present

## 2022-06-11 DIAGNOSIS — M24222 Disorder of ligament, left elbow: Secondary | ICD-10-CM | POA: Diagnosis not present

## 2022-06-11 DIAGNOSIS — M66822 Spontaneous rupture of other tendons, left upper arm: Secondary | ICD-10-CM | POA: Diagnosis not present

## 2022-06-11 DIAGNOSIS — M25522 Pain in left elbow: Secondary | ICD-10-CM | POA: Diagnosis not present

## 2022-06-14 DIAGNOSIS — Z79899 Other long term (current) drug therapy: Secondary | ICD-10-CM | POA: Diagnosis not present

## 2022-08-29 DIAGNOSIS — H43813 Vitreous degeneration, bilateral: Secondary | ICD-10-CM | POA: Diagnosis not present

## 2022-08-29 DIAGNOSIS — H31093 Other chorioretinal scars, bilateral: Secondary | ICD-10-CM | POA: Diagnosis not present

## 2022-08-29 DIAGNOSIS — H43391 Other vitreous opacities, right eye: Secondary | ICD-10-CM | POA: Diagnosis not present

## 2022-09-18 ENCOUNTER — Other Ambulatory Visit: Payer: Self-pay | Admitting: Behavioral Health

## 2022-09-18 DIAGNOSIS — R454 Irritability and anger: Secondary | ICD-10-CM

## 2022-09-18 DIAGNOSIS — R4589 Other symptoms and signs involving emotional state: Secondary | ICD-10-CM

## 2022-10-18 DIAGNOSIS — H26491 Other secondary cataract, right eye: Secondary | ICD-10-CM | POA: Diagnosis not present

## 2022-10-18 DIAGNOSIS — H33311 Horseshoe tear of retina without detachment, right eye: Secondary | ICD-10-CM | POA: Diagnosis not present

## 2022-10-18 DIAGNOSIS — H43391 Other vitreous opacities, right eye: Secondary | ICD-10-CM | POA: Diagnosis not present

## 2022-10-18 DIAGNOSIS — Z9889 Other specified postprocedural states: Secondary | ICD-10-CM | POA: Diagnosis not present

## 2022-10-25 DIAGNOSIS — H43822 Vitreomacular adhesion, left eye: Secondary | ICD-10-CM | POA: Diagnosis not present

## 2022-10-25 DIAGNOSIS — H43392 Other vitreous opacities, left eye: Secondary | ICD-10-CM | POA: Diagnosis not present

## 2022-10-25 DIAGNOSIS — Z9889 Other specified postprocedural states: Secondary | ICD-10-CM | POA: Diagnosis not present

## 2022-11-14 DIAGNOSIS — H43822 Vitreomacular adhesion, left eye: Secondary | ICD-10-CM | POA: Diagnosis not present

## 2022-11-14 DIAGNOSIS — Z9889 Other specified postprocedural states: Secondary | ICD-10-CM | POA: Diagnosis not present

## 2022-11-21 DIAGNOSIS — M859 Disorder of bone density and structure, unspecified: Secondary | ICD-10-CM | POA: Diagnosis not present

## 2022-11-21 DIAGNOSIS — E213 Hyperparathyroidism, unspecified: Secondary | ICD-10-CM | POA: Diagnosis not present

## 2022-12-17 ENCOUNTER — Other Ambulatory Visit: Payer: Self-pay | Admitting: Behavioral Health

## 2022-12-17 DIAGNOSIS — H43812 Vitreous degeneration, left eye: Secondary | ICD-10-CM | POA: Diagnosis not present

## 2022-12-17 DIAGNOSIS — H26492 Other secondary cataract, left eye: Secondary | ICD-10-CM | POA: Diagnosis not present

## 2022-12-17 DIAGNOSIS — H33312 Horseshoe tear of retina without detachment, left eye: Secondary | ICD-10-CM | POA: Diagnosis not present

## 2022-12-17 DIAGNOSIS — R4589 Other symptoms and signs involving emotional state: Secondary | ICD-10-CM

## 2022-12-17 DIAGNOSIS — R454 Irritability and anger: Secondary | ICD-10-CM

## 2022-12-17 DIAGNOSIS — H43392 Other vitreous opacities, left eye: Secondary | ICD-10-CM | POA: Diagnosis not present

## 2022-12-18 NOTE — Telephone Encounter (Signed)
He is scheduled for 9/13

## 2022-12-18 NOTE — Telephone Encounter (Signed)
 Please call to schedule appt, was due in July.

## 2022-12-31 DIAGNOSIS — F4320 Adjustment disorder, unspecified: Secondary | ICD-10-CM | POA: Diagnosis not present

## 2023-01-08 DIAGNOSIS — F4320 Adjustment disorder, unspecified: Secondary | ICD-10-CM | POA: Diagnosis not present

## 2023-01-10 ENCOUNTER — Encounter: Payer: Self-pay | Admitting: Behavioral Health

## 2023-01-10 ENCOUNTER — Ambulatory Visit (INDEPENDENT_AMBULATORY_CARE_PROVIDER_SITE_OTHER): Payer: BC Managed Care – PPO | Admitting: Behavioral Health

## 2023-01-10 DIAGNOSIS — R454 Irritability and anger: Secondary | ICD-10-CM | POA: Diagnosis not present

## 2023-01-10 DIAGNOSIS — R4589 Other symptoms and signs involving emotional state: Secondary | ICD-10-CM

## 2023-01-10 MED ORDER — DULOXETINE HCL 60 MG PO CPEP: 60.0000 mg | ORAL_CAPSULE | Freq: Every day | ORAL | 0 refills | Status: DC

## 2023-01-10 MED ORDER — DULOXETINE HCL 30 MG PO CPEP: 30.0000 mg | ORAL_CAPSULE | Freq: Every day | ORAL | 1 refills | Status: AC
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
## 2023-01-15 DIAGNOSIS — F4320 Adjustment disorder, unspecified: Secondary | ICD-10-CM | POA: Diagnosis not present

## 2023-01-22 DIAGNOSIS — D2261 Melanocytic nevi of right upper limb, including shoulder: Secondary | ICD-10-CM | POA: Diagnosis not present

## 2023-01-22 DIAGNOSIS — H43822 Vitreomacular adhesion, left eye: Secondary | ICD-10-CM | POA: Diagnosis not present

## 2023-01-22 DIAGNOSIS — H43392 Other vitreous opacities, left eye: Secondary | ICD-10-CM | POA: Diagnosis not present

## 2023-01-22 DIAGNOSIS — D225 Melanocytic nevi of trunk: Secondary | ICD-10-CM | POA: Diagnosis not present

## 2023-01-22 DIAGNOSIS — L814 Other melanin hyperpigmentation: Secondary | ICD-10-CM | POA: Diagnosis not present

## 2023-01-22 DIAGNOSIS — B078 Other viral warts: Secondary | ICD-10-CM | POA: Diagnosis not present

## 2023-01-22 DIAGNOSIS — L570 Actinic keratosis: Secondary | ICD-10-CM | POA: Diagnosis not present

## 2023-01-22 DIAGNOSIS — D2262 Melanocytic nevi of left upper limb, including shoulder: Secondary | ICD-10-CM | POA: Diagnosis not present

## 2023-01-28 DIAGNOSIS — F4320 Adjustment disorder, unspecified: Secondary | ICD-10-CM | POA: Diagnosis not present

## 2023-02-11 DIAGNOSIS — F4320 Adjustment disorder, unspecified: Secondary | ICD-10-CM | POA: Diagnosis not present

## 2023-02-20 DIAGNOSIS — F4320 Adjustment disorder, unspecified: Secondary | ICD-10-CM | POA: Diagnosis not present

## 2023-03-06 DIAGNOSIS — F4320 Adjustment disorder, unspecified: Secondary | ICD-10-CM | POA: Diagnosis not present

## 2023-03-17 DIAGNOSIS — M545 Low back pain, unspecified: Secondary | ICD-10-CM | POA: Diagnosis not present

## 2023-03-18 ENCOUNTER — Ambulatory Visit (INDEPENDENT_AMBULATORY_CARE_PROVIDER_SITE_OTHER): Payer: BC Managed Care – PPO

## 2023-03-19 DIAGNOSIS — F4320 Adjustment disorder, unspecified: Secondary | ICD-10-CM | POA: Diagnosis not present

## 2023-03-19 DIAGNOSIS — I871 Compression of vein: Secondary | ICD-10-CM | POA: Diagnosis not present

## 2023-03-20 DIAGNOSIS — M545 Low back pain, unspecified: Secondary | ICD-10-CM | POA: Diagnosis not present

## 2023-03-22 DIAGNOSIS — M47816 Spondylosis without myelopathy or radiculopathy, lumbar region: Secondary | ICD-10-CM | POA: Diagnosis not present

## 2023-03-22 DIAGNOSIS — M545 Low back pain, unspecified: Secondary | ICD-10-CM | POA: Diagnosis not present

## 2023-03-22 DIAGNOSIS — M4316 Spondylolisthesis, lumbar region: Secondary | ICD-10-CM | POA: Diagnosis not present

## 2023-03-22 DIAGNOSIS — M5136 Other intervertebral disc degeneration, lumbar region with discogenic back pain only: Secondary | ICD-10-CM | POA: Diagnosis not present

## 2023-03-22 DIAGNOSIS — M47817 Spondylosis without myelopathy or radiculopathy, lumbosacral region: Secondary | ICD-10-CM | POA: Diagnosis not present

## 2023-03-25 ENCOUNTER — Ambulatory Visit (INDEPENDENT_AMBULATORY_CARE_PROVIDER_SITE_OTHER): Payer: BC Managed Care – PPO | Admitting: Audiology

## 2023-03-25 ENCOUNTER — Ambulatory Visit (INDEPENDENT_AMBULATORY_CARE_PROVIDER_SITE_OTHER): Payer: BC Managed Care – PPO

## 2023-03-25 ENCOUNTER — Encounter (INDEPENDENT_AMBULATORY_CARE_PROVIDER_SITE_OTHER): Payer: Self-pay

## 2023-03-25 VITALS — Ht 72.0 in | Wt 230.0 lb

## 2023-03-25 DIAGNOSIS — H9312 Tinnitus, left ear: Secondary | ICD-10-CM

## 2023-03-25 DIAGNOSIS — H6123 Impacted cerumen, bilateral: Secondary | ICD-10-CM | POA: Diagnosis not present

## 2023-03-25 DIAGNOSIS — H9042 Sensorineural hearing loss, unilateral, left ear, with unrestricted hearing on the contralateral side: Secondary | ICD-10-CM

## 2023-03-25 DIAGNOSIS — M545 Low back pain, unspecified: Secondary | ICD-10-CM | POA: Diagnosis not present

## 2023-03-25 NOTE — Progress Notes (Signed)
Patient was seen today for a hearing aid check. He currently wears a left Oticon custom hearing aid. Updated the hearing aid to his new audiogram. Cleaned the vent, replaced the wax filter and battery, and brushed the device. Set patient at 80% overall gain. He was happy with the sound volume and clarity. Instructed patient to call if any concerns arise.   Jeff Trujillo, AUD, CCC-A 03/25/23

## 2023-03-25 NOTE — Progress Notes (Signed)
  [address], [address] [place], [place] [address] [phone]  Audiological Evaluation    Name: Jeff Trujillo     DOB:   04-25-1966      MRN:   [id number]                                                                                     Service Date: 03/25/2023        Patient was referred today for a hearing evaluation by Dr. Karle Barr.   Symptoms Yes Details  Hearing loss  []  Patient denied perceiving any hearing loss.  Tinnitus  [x]  Patient reported experiencing left ear tinnitus.  Balance problems  []  Patient denied vertigo/imbalance sensations.  Previous ear surgeries  []  Patient denied any previous ear surgeries.  Family history  []  Patient denied family history of hearing loss.  Amplification  [x]  Patient uses a left Oticon hearing aid.    Tympanogram: Right ear: Normal external ear canal volume with normal middle ear pressure and low tympanic membrane compliance (Type As). Left ear: Normal external ear canal volume with normal middle ear pressure and tympanic membrane compliance (Type A).    Hearing Evaluation: The audiogram was completed using conventional audiometric techniques under headphones with good reliability.   The hearing test results indicate: Right ear: Normal hearing sensitivity from [phone] Hz. Left ear: Normal hearing sensitivity from [phone] sloping to moderate sensorineural hearing loss from 4000-8000 Hz.  Speech Audiometry: Right ear- Speech Reception Threshold (SRT) was obtained at 10 dBHL. Left ear- Speech Reception Threshold (SRT) was obtained at 15 dBHL masked.   Word Recognition Score Tested using NU-6 (MLV) Right ear: 100% was obtained at a presentation level of 50 dBHL which is deemed as excellent understanding. Left ear: 96% was obtained at a presentation level of 55 dBHL with contralateral masking which is deemed as excellent understanding.    Impression:  There is not a significant difference between word recognition  scores between ears.   Recommendations: Repeat audiogram when changes are perceived or per MD. Continue use of hearing aid. Consider various tinnitus strategies, including the use of a noise generator, hearing aids, or tinnitus retraining therapy.   Conley Rolls Ellie Bryand, AUD, CCC-A 03/25/23

## 2023-03-26 DIAGNOSIS — H6123 Impacted cerumen, bilateral: Secondary | ICD-10-CM | POA: Insufficient documentation

## 2023-03-26 DIAGNOSIS — H6121 Impacted cerumen, right ear: Secondary | ICD-10-CM | POA: Insufficient documentation

## 2023-03-26 DIAGNOSIS — H9312 Tinnitus, left ear: Secondary | ICD-10-CM | POA: Insufficient documentation

## 2023-03-26 DIAGNOSIS — H9042 Sensorineural hearing loss, unilateral, left ear, with unrestricted hearing on the contralateral side: Secondary | ICD-10-CM | POA: Insufficient documentation

## 2023-03-26 NOTE — Progress Notes (Signed)
Patient ID: Jeff Trujillo, male   DOB: 1966-03-24, 57 y.o.   MRN: [id number]  Follow-up: Left ear tinnitus, left ear hearing loss  HPI: The patient is a 57 year old male who returns today for his follow-up evaluation.  The patient was previously seen for asymmetric left ear hearing loss and tinnitus.  No physical abnormality was noted.  His MRI scan was negative for retrocochlear lesion.  He was treated with high-dose steroids without significant change in his hearing.  The patient returns today complaining of increasing left ear tinnitus.  He denies any otalgia, otorrhea, or vertigo.  Exam: General: Communicates without difficulty, well nourished, no acute distress. Head: Normocephalic, no evidence injury, no tenderness, facial buttresses intact without stepoff. Face/sinus: No tenderness to palpation and percussion. Facial movement is normal and symmetric. Eyes: PERRL, EOMI. No scleral icterus, conjunctivae clear. Neuro: CN II exam reveals vision grossly intact.  No nystagmus at any point of gaze. Ears: Auricles well formed without lesions. EAC: Bilateral cerumen impaction.  Under the operating microscope, the cerumen is carefully removed with a combination of cerumen currette, alligator forceps, and suction catheters.  After the cerumen is removed, the TMs are noted to be normal. Nose: External evaluation reveals normal support and skin without lesions.  Dorsum is intact.  Anterior rhinoscopy reveals pink mucosa over anterior aspect of inferior turbinates and intact septum.  No purulence noted. Oral:  Oral cavity and oropharynx are intact, symmetric, without erythema or edema.  Mucosa is moist without lesions. Neck: Full range of motion without pain.  There is no significant lymphadenopathy.  No masses palpable.  Thyroid bed within normal limits to palpation.  Parotid glands and submandibular glands equal bilaterally without mass.  Trachea is midline. Neuro:  CN 2-12 grossly intact. Gait normal.    AUDIOMETRIC TESTING: I have read and reviewed the audiometric test, which shows stable asymmetric left ear high-frequency sensorineural hearing loss.   Assessment  1.  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 2.  Bilateral cerumen impaction.  After the cerumen removal procedure, his ear canals, tympanic membranes, and middle ear spaces are all normal. 3.  His previous MRI scan was negative for retrocochlear lesion.  Plan  1.  Otomicroscopy with bilateral cerumen removal. 2.  The physical exam findings and the hearing test results are reviewed with the patient. 3.  The strategies to cope with tinnitus, including the use of masker, hearing aids, tinnitus retraining therapy, and avoidance of caffeine and alcohol are discussed.  4.  The patient will return for reevaluation in 1 year.

## 2023-04-01 ENCOUNTER — Ambulatory Visit (INDEPENDENT_AMBULATORY_CARE_PROVIDER_SITE_OTHER): Payer: BC Managed Care – PPO | Admitting: Audiology

## 2023-04-01 DIAGNOSIS — F4320 Adjustment disorder, unspecified: Secondary | ICD-10-CM | POA: Diagnosis not present

## 2023-04-01 NOTE — Progress Notes (Addendum)
Patient dropped off his hearing aid today at the office. Patient wears a left Oticon Own 3 IIC hearing aid. He reported that the hearing aid is no longer helping with his tinnitus. Replaced the battery and wax filter and cleaned the venting. Connected the device to the software and set him to around 95% of his overall prescription gain to help improve volume and clarity perception. Increased the gain for his low-mid frequencies to help with any unnatural/distorted sound quality.   Patient came to pickup the hearing aid. He reported good sound volume and clarity. He did not notice the tinnitus while in the office. Instructed him to call if any concerns arise.   Conley Rolls Laren Orama, AUD, CCC-A 04/01/23

## 2023-05-20 DIAGNOSIS — F4320 Adjustment disorder, unspecified: Secondary | ICD-10-CM | POA: Diagnosis not present

## 2023-06-05 DIAGNOSIS — F4320 Adjustment disorder, unspecified: Secondary | ICD-10-CM | POA: Diagnosis not present

## 2023-06-08 ENCOUNTER — Other Ambulatory Visit: Payer: Self-pay | Admitting: Behavioral Health

## 2023-06-08 DIAGNOSIS — R4589 Other symptoms and signs involving emotional state: Secondary | ICD-10-CM

## 2023-06-08 DIAGNOSIS — R454 Irritability and anger: Secondary | ICD-10-CM

## 2023-07-16 DIAGNOSIS — Z79899 Other long term (current) drug therapy: Secondary | ICD-10-CM | POA: Diagnosis not present

## 2023-07-16 DIAGNOSIS — Z125 Encounter for screening for malignant neoplasm of prostate: Secondary | ICD-10-CM | POA: Diagnosis not present

## 2023-07-16 DIAGNOSIS — R7301 Impaired fasting glucose: Secondary | ICD-10-CM | POA: Diagnosis not present

## 2023-07-16 DIAGNOSIS — E785 Hyperlipidemia, unspecified: Secondary | ICD-10-CM | POA: Diagnosis not present

## 2023-07-17 DIAGNOSIS — Z0000 Encounter for general adult medical examination without abnormal findings: Secondary | ICD-10-CM | POA: Diagnosis not present

## 2023-07-17 DIAGNOSIS — Z2821 Immunization not carried out because of patient refusal: Secondary | ICD-10-CM | POA: Diagnosis not present

## 2023-07-17 DIAGNOSIS — Z7185 Encounter for immunization safety counseling: Secondary | ICD-10-CM | POA: Diagnosis not present

## 2023-07-17 DIAGNOSIS — R0981 Nasal congestion: Secondary | ICD-10-CM | POA: Diagnosis not present

## 2023-07-17 DIAGNOSIS — E785 Hyperlipidemia, unspecified: Secondary | ICD-10-CM | POA: Diagnosis not present

## 2023-07-17 DIAGNOSIS — Z1331 Encounter for screening for depression: Secondary | ICD-10-CM | POA: Diagnosis not present

## 2023-07-17 DIAGNOSIS — Z23 Encounter for immunization: Secondary | ICD-10-CM | POA: Diagnosis not present

## 2023-10-01 DIAGNOSIS — H3562 Retinal hemorrhage, left eye: Secondary | ICD-10-CM | POA: Diagnosis not present

## 2023-10-16 DIAGNOSIS — R1084 Generalized abdominal pain: Secondary | ICD-10-CM | POA: Diagnosis not present

## 2023-10-16 DIAGNOSIS — K5900 Constipation, unspecified: Secondary | ICD-10-CM | POA: Diagnosis not present

## 2023-10-17 DIAGNOSIS — K56609 Unspecified intestinal obstruction, unspecified as to partial versus complete obstruction: Secondary | ICD-10-CM | POA: Diagnosis not present

## 2023-10-17 DIAGNOSIS — E860 Dehydration: Secondary | ICD-10-CM | POA: Diagnosis not present

## 2023-10-17 DIAGNOSIS — K56600 Partial intestinal obstruction, unspecified as to cause: Secondary | ICD-10-CM | POA: Diagnosis not present

## 2023-10-17 DIAGNOSIS — Z791 Long term (current) use of non-steroidal anti-inflammatories (NSAID): Secondary | ICD-10-CM | POA: Diagnosis not present

## 2023-10-17 DIAGNOSIS — K668 Other specified disorders of peritoneum: Secondary | ICD-10-CM | POA: Diagnosis not present

## 2023-10-17 DIAGNOSIS — R109 Unspecified abdominal pain: Secondary | ICD-10-CM | POA: Diagnosis not present

## 2023-10-17 DIAGNOSIS — R1110 Vomiting, unspecified: Secondary | ICD-10-CM | POA: Diagnosis not present

## 2023-10-17 DIAGNOSIS — Z79899 Other long term (current) drug therapy: Secondary | ICD-10-CM | POA: Diagnosis not present

## 2023-10-17 DIAGNOSIS — Z4682 Encounter for fitting and adjustment of non-vascular catheter: Secondary | ICD-10-CM | POA: Diagnosis not present

## 2023-10-17 DIAGNOSIS — R197 Diarrhea, unspecified: Secondary | ICD-10-CM | POA: Diagnosis not present

## 2023-10-17 DIAGNOSIS — Z6831 Body mass index (BMI) 31.0-31.9, adult: Secondary | ICD-10-CM | POA: Diagnosis not present

## 2023-10-17 DIAGNOSIS — K5732 Diverticulitis of large intestine without perforation or abscess without bleeding: Secondary | ICD-10-CM | POA: Diagnosis not present

## 2023-10-17 DIAGNOSIS — R1084 Generalized abdominal pain: Secondary | ICD-10-CM | POA: Diagnosis not present

## 2023-10-17 DIAGNOSIS — K5989 Other specified functional intestinal disorders: Secondary | ICD-10-CM | POA: Diagnosis not present

## 2023-10-17 DIAGNOSIS — K5900 Constipation, unspecified: Secondary | ICD-10-CM | POA: Diagnosis not present

## 2023-10-17 DIAGNOSIS — E785 Hyperlipidemia, unspecified: Secondary | ICD-10-CM | POA: Diagnosis not present

## 2023-10-17 DIAGNOSIS — K567 Ileus, unspecified: Secondary | ICD-10-CM | POA: Diagnosis not present

## 2023-10-17 DIAGNOSIS — Z978 Presence of other specified devices: Secondary | ICD-10-CM | POA: Diagnosis not present

## 2023-10-17 DIAGNOSIS — R112 Nausea with vomiting, unspecified: Secondary | ICD-10-CM | POA: Diagnosis not present

## 2023-10-17 DIAGNOSIS — K5730 Diverticulosis of large intestine without perforation or abscess without bleeding: Secondary | ICD-10-CM | POA: Diagnosis not present

## 2023-10-17 DIAGNOSIS — K7689 Other specified diseases of liver: Secondary | ICD-10-CM | POA: Diagnosis not present

## 2023-10-17 DIAGNOSIS — E663 Overweight: Secondary | ICD-10-CM | POA: Diagnosis not present

## 2023-10-17 DIAGNOSIS — K259 Gastric ulcer, unspecified as acute or chronic, without hemorrhage or perforation: Secondary | ICD-10-CM | POA: Diagnosis not present

## 2023-10-22 DIAGNOSIS — K631 Perforation of intestine (nontraumatic): Secondary | ICD-10-CM | POA: Diagnosis not present

## 2023-10-22 DIAGNOSIS — K5792 Diverticulitis of intestine, part unspecified, without perforation or abscess without bleeding: Secondary | ICD-10-CM | POA: Diagnosis not present

## 2023-10-23 DIAGNOSIS — H3562 Retinal hemorrhage, left eye: Secondary | ICD-10-CM | POA: Diagnosis not present

## 2023-11-12 DIAGNOSIS — F4320 Adjustment disorder, unspecified: Secondary | ICD-10-CM | POA: Diagnosis not present

## 2023-11-24 DIAGNOSIS — F4320 Adjustment disorder, unspecified: Secondary | ICD-10-CM | POA: Diagnosis not present

## 2023-11-26 DIAGNOSIS — R197 Diarrhea, unspecified: Secondary | ICD-10-CM | POA: Diagnosis not present

## 2023-11-27 DIAGNOSIS — M859 Disorder of bone density and structure, unspecified: Secondary | ICD-10-CM | POA: Diagnosis not present

## 2023-11-27 DIAGNOSIS — E213 Hyperparathyroidism, unspecified: Secondary | ICD-10-CM | POA: Diagnosis not present

## 2023-11-27 DIAGNOSIS — R7303 Prediabetes: Secondary | ICD-10-CM | POA: Diagnosis not present

## 2023-11-27 DIAGNOSIS — E785 Hyperlipidemia, unspecified: Secondary | ICD-10-CM | POA: Diagnosis not present

## 2023-12-01 DIAGNOSIS — F4320 Adjustment disorder, unspecified: Secondary | ICD-10-CM | POA: Diagnosis not present

## 2023-12-08 DIAGNOSIS — A0472 Enterocolitis due to Clostridium difficile, not specified as recurrent: Secondary | ICD-10-CM | POA: Diagnosis not present

## 2023-12-08 DIAGNOSIS — F4320 Adjustment disorder, unspecified: Secondary | ICD-10-CM | POA: Diagnosis not present

## 2023-12-08 DIAGNOSIS — K5792 Diverticulitis of intestine, part unspecified, without perforation or abscess without bleeding: Secondary | ICD-10-CM | POA: Diagnosis not present

## 2023-12-08 DIAGNOSIS — K631 Perforation of intestine (nontraumatic): Secondary | ICD-10-CM | POA: Diagnosis not present

## 2023-12-15 DIAGNOSIS — F4320 Adjustment disorder, unspecified: Secondary | ICD-10-CM | POA: Diagnosis not present

## 2023-12-30 DIAGNOSIS — K5730 Diverticulosis of large intestine without perforation or abscess without bleeding: Secondary | ICD-10-CM | POA: Diagnosis not present

## 2023-12-30 DIAGNOSIS — Z09 Encounter for follow-up examination after completed treatment for conditions other than malignant neoplasm: Secondary | ICD-10-CM | POA: Diagnosis not present

## 2023-12-30 DIAGNOSIS — R933 Abnormal findings on diagnostic imaging of other parts of digestive tract: Secondary | ICD-10-CM | POA: Diagnosis not present

## 2023-12-30 DIAGNOSIS — K5289 Other specified noninfective gastroenteritis and colitis: Secondary | ICD-10-CM | POA: Diagnosis not present

## 2023-12-30 DIAGNOSIS — K2289 Other specified disease of esophagus: Secondary | ICD-10-CM | POA: Diagnosis not present

## 2023-12-30 DIAGNOSIS — Z8719 Personal history of other diseases of the digestive system: Secondary | ICD-10-CM | POA: Diagnosis not present

## 2023-12-31 DIAGNOSIS — L821 Other seborrheic keratosis: Secondary | ICD-10-CM | POA: Diagnosis not present

## 2023-12-31 DIAGNOSIS — L905 Scar conditions and fibrosis of skin: Secondary | ICD-10-CM | POA: Diagnosis not present

## 2023-12-31 DIAGNOSIS — L814 Other melanin hyperpigmentation: Secondary | ICD-10-CM | POA: Diagnosis not present

## 2023-12-31 DIAGNOSIS — D2271 Melanocytic nevi of right lower limb, including hip: Secondary | ICD-10-CM | POA: Diagnosis not present

## 2024-01-04 DIAGNOSIS — K5900 Constipation, unspecified: Secondary | ICD-10-CM | POA: Diagnosis not present

## 2024-01-04 DIAGNOSIS — K56600 Partial intestinal obstruction, unspecified as to cause: Secondary | ICD-10-CM | POA: Diagnosis not present

## 2024-01-05 DIAGNOSIS — F4320 Adjustment disorder, unspecified: Secondary | ICD-10-CM | POA: Diagnosis not present

## 2024-02-02 DIAGNOSIS — K36 Other appendicitis: Secondary | ICD-10-CM | POA: Diagnosis not present

## 2024-02-02 DIAGNOSIS — K388 Other specified diseases of appendix: Secondary | ICD-10-CM | POA: Diagnosis not present

## 2024-02-02 HISTORY — PX: APPENDECTOMY: SHX54

## 2024-02-05 ENCOUNTER — Encounter (INDEPENDENT_AMBULATORY_CARE_PROVIDER_SITE_OTHER): Payer: Self-pay | Admitting: Otolaryngology

## 2024-02-05 ENCOUNTER — Ambulatory Visit (INDEPENDENT_AMBULATORY_CARE_PROVIDER_SITE_OTHER): Payer: BC Managed Care – PPO | Admitting: Otolaryngology

## 2024-02-05 VITALS — BP 119/79 | HR 90 | Temp 97.3°F

## 2024-02-05 DIAGNOSIS — J310 Chronic rhinitis: Secondary | ICD-10-CM

## 2024-02-05 DIAGNOSIS — J309 Allergic rhinitis, unspecified: Secondary | ICD-10-CM | POA: Diagnosis not present

## 2024-02-05 DIAGNOSIS — H9042 Sensorineural hearing loss, unilateral, left ear, with unrestricted hearing on the contralateral side: Secondary | ICD-10-CM

## 2024-02-05 DIAGNOSIS — H6121 Impacted cerumen, right ear: Secondary | ICD-10-CM | POA: Diagnosis not present

## 2024-02-05 DIAGNOSIS — H9313 Tinnitus, bilateral: Secondary | ICD-10-CM

## 2024-02-05 DIAGNOSIS — H9312 Tinnitus, left ear: Secondary | ICD-10-CM

## 2024-02-05 MED ORDER — IPRATROPIUM BROMIDE 0.06 % NA SOLN: 2.0000 | Freq: Two times a day (BID) | NASAL | 12 refills | Status: AC | PRN

## 2024-02-07 DIAGNOSIS — J310 Chronic rhinitis: Secondary | ICD-10-CM | POA: Insufficient documentation

## 2024-02-07 NOTE — Progress Notes (Signed)
 Patient ID: Jeff Trujillo, male   DOB: 1965/11/18, 58 y.o.   MRN: [id number]  Follow-up: Left ear tinnitus, left ear hearing loss   Discussed the use of AI scribe software for clinical note transcription with the patient, who gave verbal consent to proceed.  History of Present Illness [name] is a 58 year old male who presents with worsening tinnitus in the left ear and new onset in the right ear.  He has experienced worsening tinnitus in his left ear, which has become louder over time, and new onset tinnitus in the right ear, though it is less pronounced. He denies pain and does not report any significant change in hearing. The ringing in the left ear is described as much louder compared to the right.  He experiences occasional coughing after eating, which he attributes to drainage. He uses Allegra for his symptoms, which he associates with lifelong allergies. He is unsure if the drainage worsens during mealtime. No significant coughing is reported, but there is persistent nasal drainage.  Exam: General: Communicates without difficulty, well nourished, no acute distress. Head: Normocephalic, no evidence injury, no tenderness, facial buttresses intact without stepoff. Face/sinus: No tenderness to palpation and percussion. Facial movement is normal and symmetric. Eyes: PERRL, EOMI. No scleral icterus, conjunctivae clear. Neuro: CN II exam reveals vision grossly intact.  No nystagmus at any point of gaze. Ears: Auricles well formed without lesions.  Right ear cerumen impaction.  Nose: External evaluation reveals normal support and skin without lesions.  Dorsum is intact.  Anterior rhinoscopy reveals congested mucosa over anterior aspect of inferior turbinates and intact septum.  No purulence noted. Oral:  Oral cavity and oropharynx are intact, symmetric, without erythema or edema.  Mucosa is moist without lesions. Neck: Full range of motion without pain.  There is no significant  lymphadenopathy.  No masses palpable.  Thyroid  bed within normal limits to palpation.  Parotid glands and submandibular glands equal bilaterally without mass.  Trachea is midline. Neuro:  CN 2-12 grossly intact.   Procedure: Right ear cerumen disimpaction Anesthesia: None Description: Under the operating microscope, the cerumen is carefully removed with a combination of cerumen currette, alligator forceps, and suction catheters.  After the cerumen is removed, the TMs are noted to be normal.  No mass, erythema, or lesions. The patient tolerated the procedure well.    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 The left ear tinnitus has worsened, and the right ear has started to experience ringing, though less severe than the left. No significant change in hearing reported. - Schedule audiology appointment for hearing test.  Right ear cerumen impaction Cerumen impaction predominantly on the right side. Cerumen was removed during the visit. Post-cleaning, the ear canal, eardrum, and air space appear normal.  Allergic rhinitis with postnasal drainage Chronic allergic rhinitis with postnasal drainage causing nasal congestion and occasional cough. Currently using Allegra for allergy management. Discussed the use of Atrovent nasal spray as a more targeted treatment for excessive nasal drainage. - Prescribe Atrovent nasal spray for use as needed, two sprays in each nostril up to twice a day. - Instruct to reduce usage if excessive dryness occurs.

## 2024-02-09 DIAGNOSIS — F4320 Adjustment disorder, unspecified: Secondary | ICD-10-CM | POA: Diagnosis not present

## 2024-02-11 DIAGNOSIS — M5440 Lumbago with sciatica, unspecified side: Secondary | ICD-10-CM | POA: Diagnosis not present

## 2024-02-11 DIAGNOSIS — M25511 Pain in right shoulder: Secondary | ICD-10-CM | POA: Diagnosis not present

## 2024-02-11 DIAGNOSIS — M25512 Pain in left shoulder: Secondary | ICD-10-CM | POA: Diagnosis not present

## 2024-02-11 DIAGNOSIS — G8929 Other chronic pain: Secondary | ICD-10-CM | POA: Diagnosis not present

## 2024-02-11 DIAGNOSIS — E876 Hypokalemia: Secondary | ICD-10-CM | POA: Diagnosis not present

## 2024-02-11 DIAGNOSIS — Z23 Encounter for immunization: Secondary | ICD-10-CM | POA: Diagnosis not present

## 2024-02-12 ENCOUNTER — Ambulatory Visit (INDEPENDENT_AMBULATORY_CARE_PROVIDER_SITE_OTHER): Admitting: Audiology

## 2024-02-12 ENCOUNTER — Ambulatory Visit (INDEPENDENT_AMBULATORY_CARE_PROVIDER_SITE_OTHER): Payer: Self-pay | Admitting: Audiology

## 2024-02-12 DIAGNOSIS — H903 Sensorineural hearing loss, bilateral: Secondary | ICD-10-CM

## 2024-02-12 NOTE — Progress Notes (Signed)
  [address] [place], [place] [address] [phone]  Hearing Aid Check     Jeff Trujillo was added on to the schedule to make some programming changes.    Accompanied ab:[id number]   Left  Hearing aid manufacturer Oticon OWN 3 IIC SN: [id number]  Hearing aid style Invisible in the canal  Hearing aid battery 10  Receiver   Dome/ custom earpiece   Retention wire   Warranty expiration date 05-08-2025  Loss and Damage unknown  Additional accessories Expiration date   Initial fitting date 04-16-2022  Device was fit at: [facility]    Chief complaint: Patient reports left tinnitus is no longer managed by the hearing aid.   Actions taken: Cleaned the device and placed a new wax filter. Connected the device to the software using the express link and increased a 2 dBHL gain overall.  Patient said he could not hear the tinnitus while he was clinic after changes were made.   Services fee: $0 was paid at checkout.  Recommend: Return for a hearing aid check as needed. Suggest it is scheduled in order to including live speech mapping to make sure hearing aid settings are meeting prescription targets. Return for a hearing evaluation and to see an ENT, if concerns with hearing changes arise.    [name] [name], AUD

## 2024-02-12 NOTE — Progress Notes (Signed)
  [address] [place], [place] [address] [phone]  Audiological Evaluation    Name: Jeff Trujillo     DOB:   Aug 23, 1965      MRN:   [id number]                                                                                     Service Date: 02/12/2024     Accompanied by: unaccompanied   Patient comes today after Dr. Karis, ENT sent a referral for a hearing evaluation due to concerns with tinnitus.   Symptoms Yes Details  Hearing loss  [x]  Known hearing loss asymmetry, worse in the left ear.  Previous audiogram from 03-25-2023:  Right ear: Normal hearing sensitivity from [phone] Hz. Left ear: Normal hearing sensitivity from [phone] sloping to moderate sensorineural hearing loss from 4000-8000 Hz.   Tinnitus  [x]  Longstanding left ear tinnitus, perceives it to be louder and may be starting to hear some in the right side as well.  Ear pain/ infections/pressure  []    Balance problems  []    Noise exposure history  []    Previous ear surgeries  []    Family history of hearing loss  []    Amplification  [x]  Patient uses a left Oticon IIC hearing aid.   Other  []      Otoscopy: Right ear: Clear external ear canal and notable landmarks visualized on the tympanic membrane. Left ear:  Clear external ear canal and notable landmarks visualized on the tympanic membrane.  Tympanometry: Right ear: Type As- Normal external ear canal volume with normal middle ear pressure and low tympanic membrane compliance. Left ear: Type A- Normal external ear canal volume with normal middle ear pressure and tympanic membrane compliance.    Pure tone Audiometry: Right ear- Normal hearing from [phone] Hz, except for a mild sensorineural hearing loss at 6000 Hz.  Left ear-  Normal hearing from [phone] Hz, then mild to moderate sensorineural hearing loss from 4000 Hz - 8000 Hz.  Speech Audiometry: Right ear- Speech Reception Threshold (SRT) was obtained at 5 dBHL. Left ear-Speech  Reception Threshold (SRT) was obtained at 5 dBHL.   Word Recognition Score Tested using NU-6 (recorded) Right ear: 100% was obtained at a presentation level of 50 dBHL with contralateral masking which is deemed as  excellent. Left ear: 100% was obtained at a presentation level of 50 dBHL with contralateral masking which is deemed as  excellent.   The hearing test results were completed under headphones and results are deemed to be of good reliability. Test technique:  conventional    Impression: There was a slight decline at 6000 Hz, in the right ear. There was a 10dBHL change at 4000 Hz , in the left ear.  Recommendations: Follow up with ENT as scheduled for today. Return for a hearing evaluation if concerns with hearing changes arise or per MD recommendation. Use hearing protection when exposed to loud/damaging sounds. Consider various tinnitus strategies, including the use of a sound generator, hearing aids, and/or tinnitus retraining therapy.    Horton Ellithorpe [name], AUD

## 2024-02-18 DIAGNOSIS — M51361 Other intervertebral disc degeneration, lumbar region with lower extremity pain only: Secondary | ICD-10-CM | POA: Diagnosis not present

## 2024-02-18 DIAGNOSIS — M48061 Spinal stenosis, lumbar region without neurogenic claudication: Secondary | ICD-10-CM | POA: Diagnosis not present

## 2024-02-18 DIAGNOSIS — M4726 Other spondylosis with radiculopathy, lumbar region: Secondary | ICD-10-CM | POA: Diagnosis not present

## 2024-02-18 DIAGNOSIS — M5459 Other low back pain: Secondary | ICD-10-CM | POA: Diagnosis not present

## 2024-02-25 DIAGNOSIS — F4320 Adjustment disorder, unspecified: Secondary | ICD-10-CM | POA: Diagnosis not present

## 2024-03-15 DIAGNOSIS — R194 Change in bowel habit: Secondary | ICD-10-CM | POA: Diagnosis not present

## 2024-03-15 DIAGNOSIS — K50112 Crohn's disease of large intestine with intestinal obstruction: Secondary | ICD-10-CM | POA: Diagnosis not present

## 2024-03-15 DIAGNOSIS — M75111 Incomplete rotator cuff tear or rupture of right shoulder, not specified as traumatic: Secondary | ICD-10-CM | POA: Diagnosis not present

## 2024-03-15 DIAGNOSIS — S43432A Superior glenoid labrum lesion of left shoulder, initial encounter: Secondary | ICD-10-CM | POA: Diagnosis not present

## 2024-03-15 DIAGNOSIS — S43431A Superior glenoid labrum lesion of right shoulder, initial encounter: Secondary | ICD-10-CM | POA: Diagnosis not present

## 2024-03-15 DIAGNOSIS — M67911 Unspecified disorder of synovium and tendon, right shoulder: Secondary | ICD-10-CM | POA: Diagnosis not present

## 2024-03-15 DIAGNOSIS — M19012 Primary osteoarthritis, left shoulder: Secondary | ICD-10-CM | POA: Diagnosis not present

## 2024-03-15 DIAGNOSIS — M25411 Effusion, right shoulder: Secondary | ICD-10-CM | POA: Diagnosis not present

## 2024-03-15 DIAGNOSIS — Z8619 Personal history of other infectious and parasitic diseases: Secondary | ICD-10-CM | POA: Diagnosis not present

## 2024-03-15 DIAGNOSIS — M75112 Incomplete rotator cuff tear or rupture of left shoulder, not specified as traumatic: Secondary | ICD-10-CM | POA: Diagnosis not present

## 2024-03-15 DIAGNOSIS — M24112 Other articular cartilage disorders, left shoulder: Secondary | ICD-10-CM | POA: Diagnosis not present

## 2024-03-15 DIAGNOSIS — Z8719 Personal history of other diseases of the digestive system: Secondary | ICD-10-CM | POA: Diagnosis not present

## 2024-03-16 DIAGNOSIS — F4320 Adjustment disorder, unspecified: Secondary | ICD-10-CM | POA: Diagnosis not present

## 2024-03-17 DIAGNOSIS — M19012 Primary osteoarthritis, left shoulder: Secondary | ICD-10-CM | POA: Diagnosis not present

## 2024-03-17 DIAGNOSIS — M19011 Primary osteoarthritis, right shoulder: Secondary | ICD-10-CM | POA: Diagnosis not present

## 2024-04-08 DIAGNOSIS — F4320 Adjustment disorder, unspecified: Secondary | ICD-10-CM | POA: Diagnosis not present

## 2024-04-15 DIAGNOSIS — F4320 Adjustment disorder, unspecified: Secondary | ICD-10-CM | POA: Diagnosis not present

## 2024-04-20 DIAGNOSIS — F4320 Adjustment disorder, unspecified: Secondary | ICD-10-CM | POA: Diagnosis not present
